# Patient Record
Sex: Male | Born: 1960
Health system: Southern US, Community
[De-identification: ages and names within clinical notes are randomized; demographics above are authoritative.]

## PROBLEM LIST (undated history)

## (undated) DIAGNOSIS — I472 Ventricular tachycardia: Secondary | ICD-10-CM

## (undated) DIAGNOSIS — I4729 Other ventricular tachycardia: Secondary | ICD-10-CM

## (undated) DIAGNOSIS — I10 Essential (primary) hypertension: Secondary | ICD-10-CM

## (undated) DIAGNOSIS — R001 Bradycardia, unspecified: Secondary | ICD-10-CM

## (undated) DIAGNOSIS — R55 Syncope and collapse: Secondary | ICD-10-CM

## (undated) DIAGNOSIS — E785 Hyperlipidemia, unspecified: Secondary | ICD-10-CM

## (undated) DIAGNOSIS — I2510 Atherosclerotic heart disease of native coronary artery without angina pectoris: Secondary | ICD-10-CM

## (undated) DIAGNOSIS — R634 Abnormal weight loss: Secondary | ICD-10-CM

## (undated) DIAGNOSIS — I471 Supraventricular tachycardia, unspecified: Secondary | ICD-10-CM

## (undated) DIAGNOSIS — Z9289 Personal history of other medical treatment: Secondary | ICD-10-CM

## (undated) HISTORY — PX: CARDIAC CATHETERIZATION: SHX172

## (undated) HISTORY — DX: Other ventricular tachycardia: I47.29

## (undated) HISTORY — DX: Bradycardia, unspecified: R00.1

## (undated) HISTORY — DX: Supraventricular tachycardia: I47.1

## (undated) HISTORY — DX: Supraventricular tachycardia, unspecified: I47.10

## (undated) HISTORY — DX: Syncope and collapse: R55

## (undated) HISTORY — DX: Ventricular tachycardia: I47.2

## (undated) HISTORY — DX: Abnormal weight loss: R63.4

## (undated) HISTORY — DX: Essential (primary) hypertension: I10

## (undated) HISTORY — DX: Hyperlipidemia, unspecified: E78.5

## (undated) HISTORY — DX: Personal history of other medical treatment: Z92.89

---

## 2001-01-10 ENCOUNTER — Emergency Department (HOSPITAL_COMMUNITY): Admission: EM | Admit: 2001-01-10 | Discharge: 2001-01-10 | Payer: Self-pay | Admitting: Emergency Medicine

## 2001-01-10 ENCOUNTER — Encounter: Payer: Self-pay | Admitting: *Deleted

## 2009-09-12 ENCOUNTER — Emergency Department (HOSPITAL_COMMUNITY): Admission: EM | Admit: 2009-09-12 | Discharge: 2009-09-12 | Payer: Self-pay | Admitting: Emergency Medicine

## 2009-09-16 ENCOUNTER — Inpatient Hospital Stay (HOSPITAL_COMMUNITY): Admission: AD | Admit: 2009-09-16 | Discharge: 2009-09-21 | Payer: Self-pay | Admitting: Cardiovascular Disease

## 2009-09-20 ENCOUNTER — Encounter (INDEPENDENT_AMBULATORY_CARE_PROVIDER_SITE_OTHER): Payer: Self-pay | Admitting: Cardiovascular Disease

## 2011-03-15 LAB — BASIC METABOLIC PANEL
BUN: 15 mg/dL (ref 6–23)
Calcium: 8.5 mg/dL (ref 8.4–10.5)
Calcium: 8.5 mg/dL (ref 8.4–10.5)
Calcium: 8.7 mg/dL (ref 8.4–10.5)
Chloride: 103 mEq/L (ref 96–112)
Creatinine, Ser: 0.99 mg/dL (ref 0.4–1.5)
GFR calc Af Amer: >60 mL/min (ref >60)
GFR calc non Af Amer: >60 mL/min (ref >60)
GFR calc non Af Amer: >60 mL/min (ref >60)
GFR calc non Af Amer: >60 mL/min (ref >60)
GFR calc non Af Amer: >60 mL/min (ref >60)
Glucose, Bld: 106 mg/dL — ABNORMAL HIGH (ref 70–99)
Glucose, Bld: 109 mg/dL — ABNORMAL HIGH (ref 70–99)
Glucose, Bld: 111 mg/dL — ABNORMAL HIGH (ref 70–99)
Potassium: 3.5 mEq/L (ref 3.5–5.1)
Potassium: 3.9 mEq/L (ref 3.5–5.1)
Potassium: 4.2 mEq/L (ref 3.5–5.1)
Sodium: 136 mEq/L (ref 135–145)
Sodium: 137 mEq/L (ref 135–145)
Sodium: 137 mEq/L (ref 135–145)
Sodium: 139 mEq/L (ref 135–145)

## 2011-03-15 LAB — COMPREHENSIVE METABOLIC PANEL
ALT: 21 U/L (ref 0–53)
ALT: 21 U/L (ref 0–53)
AST: 25 U/L (ref 0–37)
Albumin: 3.8 g/dL (ref 3.5–5.2)
Albumin: 4.1 g/dL (ref 3.5–5.2)
Alkaline Phosphatase: 64 U/L (ref 39–117)
CO2: 25 mEq/L (ref 19–32)
Calcium: 8.9 mg/dL (ref 8.4–10.5)
Chloride: 105 mEq/L (ref 96–112)
Creatinine, Ser: 1 mg/dL (ref 0.4–1.5)
GFR calc Af Amer: >60 mL/min (ref >60)
GFR calc Af Amer: >60 mL/min (ref >60)
GFR calc non Af Amer: >60 mL/min (ref >60)
Potassium: 5.4 mEq/L — ABNORMAL HIGH (ref 3.5–5.1)
Sodium: 138 mEq/L (ref 135–145)
Sodium: 139 mEq/L (ref 135–145)
Total Protein: 7.2 g/dL (ref 6.0–8.3)

## 2011-03-15 LAB — CBC
HCT: 36.2 % — ABNORMAL LOW (ref 39.0–52.0)
HCT: 37.8 % — ABNORMAL LOW (ref 39.0–52.0)
HCT: 40.5 % (ref 39.0–52.0)
HCT: 40.9 % (ref 39.0–52.0)
Hemoglobin: 13 g/dL (ref 13.0–17.0)
Hemoglobin: 13.7 g/dL (ref 13.0–17.0)
Hemoglobin: 14 g/dL (ref 13.0–17.0)
MCHC: 34.2 g/dL (ref 30.0–36.0)
MCHC: 34.3 g/dL (ref 30.0–36.0)
MCHC: 34.3 g/dL (ref 30.0–36.0)
MCV: 92.5 fL (ref 78.0–100.0)
MCV: 93.3 fL (ref 78.0–100.0)
Platelets: 121 10*3/uL — ABNORMAL LOW (ref 150–400)
Platelets: 152 10*3/uL (ref 150–400)
Platelets: 162 10*3/uL (ref 150–400)
Platelets: 178 10*3/uL (ref 150–400)
RBC: 4.34 MIL/uL (ref 4.22–5.81)
RBC: 4.38 MIL/uL (ref 4.22–5.81)
RDW: 12.5 % (ref 11.5–15.5)
RDW: 12.5 % (ref 11.5–15.5)
RDW: 12.8 % (ref 11.5–15.5)
RDW: 12.9 % (ref 11.5–15.5)
WBC: 5.4 10*3/uL (ref 4.0–10.5)
WBC: 5.9 10*3/uL (ref 4.0–10.5)
WBC: 7.6 10*3/uL (ref 4.0–10.5)

## 2011-03-15 LAB — HEMOGLOBIN A1C
Hgb A1c MFr Bld: 5.6 % (ref 4.6–6.1)
Mean Plasma Glucose: 114 mg/dL

## 2011-03-15 LAB — MAGNESIUM: Magnesium: 2.3 mg/dL (ref 1.5–2.5)

## 2011-03-15 LAB — PROTIME-INR: INR: 1.02 (ref 0.00–1.49)

## 2011-03-15 LAB — DIFFERENTIAL
Basophils Relative: 0 % (ref 0–1)
Eosinophils Absolute: 0 10*3/uL (ref 0.0–0.7)
Monocytes Absolute: 0.5 10*3/uL (ref 0.1–1.0)
Monocytes Relative: 4 % (ref 3–12)

## 2011-03-15 LAB — CARDIAC PANEL(CRET KIN+CKTOT+MB+TROPI)
CK, MB: 3.9 ng/mL (ref 0.3–4.0)
CK, MB: 43.5 ng/mL — ABNORMAL HIGH (ref 0.3–4.0)
CK, MB: 8.2 ng/mL — ABNORMAL HIGH (ref 0.3–4.0)
CK, MB: 86.3 ng/mL — ABNORMAL HIGH (ref 0.3–4.0)
Relative Index: 10.8 — ABNORMAL HIGH (ref 0.0–2.5)
Relative Index: 3.1 — ABNORMAL HIGH (ref 0.0–2.5)
Relative Index: 4.5 — ABNORMAL HIGH (ref 0.0–2.5)
Troponin I: 2.45 ng/mL (ref 0.00–0.06)
Troponin I: 3.15 ng/mL (ref 0.00–0.06)

## 2011-03-15 LAB — LIPID PANEL
LDL Cholesterol: 163 mg/dL — ABNORMAL HIGH (ref 0–99)
Total CHOL/HDL Ratio: 5.7 RATIO
VLDL: 21 mg/dL (ref 0–40)

## 2011-03-15 LAB — BRAIN NATRIURETIC PEPTIDE: Pro B Natriuretic peptide (BNP): 161 pg/mL — ABNORMAL HIGH (ref 0.0–100.0)

## 2011-03-15 LAB — POCT CARDIAC MARKERS
Troponin i, poc: <0.05 ng/mL (ref 0.00–0.09)
Troponin i, poc: <0.05 ng/mL (ref 0.00–0.09)

## 2011-03-15 LAB — GLUCOSE, CAPILLARY: Glucose-Capillary: 101 mg/dL — ABNORMAL HIGH (ref 70–99)

## 2011-06-26 ENCOUNTER — Emergency Department (HOSPITAL_BASED_OUTPATIENT_CLINIC_OR_DEPARTMENT_OTHER)
Admission: EM | Admit: 2011-06-26 | Discharge: 2011-06-26 | Disposition: A | Discharge status: Home / Self Care | Payer: Federal, State, Local not specified - PPO | Attending: Emergency Medicine | Admitting: Emergency Medicine

## 2011-06-26 ENCOUNTER — Encounter: Payer: Self-pay | Admitting: Student

## 2011-06-26 ENCOUNTER — Emergency Department (INDEPENDENT_AMBULATORY_CARE_PROVIDER_SITE_OTHER): Payer: Federal, State, Local not specified - PPO

## 2011-06-26 ENCOUNTER — Emergency Department (HOSPITAL_BASED_OUTPATIENT_CLINIC_OR_DEPARTMENT_OTHER): Payer: Federal, State, Local not specified - PPO

## 2011-06-26 DIAGNOSIS — A779 Spotted fever, unspecified: Secondary | ICD-10-CM | POA: Insufficient documentation

## 2011-06-26 DIAGNOSIS — I252 Old myocardial infarction: Secondary | ICD-10-CM | POA: Insufficient documentation

## 2011-06-26 DIAGNOSIS — A770 Spotted fever due to Rickettsia rickettsii: Secondary | ICD-10-CM | POA: Diagnosis present

## 2011-06-26 DIAGNOSIS — I2510 Atherosclerotic heart disease of native coronary artery without angina pectoris: Secondary | ICD-10-CM | POA: Insufficient documentation

## 2011-06-26 DIAGNOSIS — R509 Fever, unspecified: Secondary | ICD-10-CM

## 2011-06-26 DIAGNOSIS — D696 Thrombocytopenia, unspecified: Secondary | ICD-10-CM | POA: Insufficient documentation

## 2011-06-26 DIAGNOSIS — R51 Headache: Secondary | ICD-10-CM | POA: Insufficient documentation

## 2011-06-26 HISTORY — DX: Atherosclerotic heart disease of native coronary artery without angina pectoris: I25.10

## 2011-06-26 LAB — COMPREHENSIVE METABOLIC PANEL
ALT: 63 U/L — ABNORMAL HIGH (ref 0–53)
AST: 54 U/L — ABNORMAL HIGH (ref 0–37)
Alkaline Phosphatase: 60 U/L (ref 39–117)
CO2: 28 mEq/L (ref 19–32)
Calcium: 9.1 mg/dL (ref 8.4–10.5)
Chloride: 97 mEq/L (ref 96–112)
GFR calc Af Amer: >60 mL/min (ref >60)
GFR calc non Af Amer: >60 mL/min (ref >60)
Glucose, Bld: 120 mg/dL — ABNORMAL HIGH (ref 70–99)
Potassium: 4.3 mEq/L (ref 3.5–5.1)
Sodium: 138 mEq/L (ref 135–145)
Total Bilirubin: 0.5 mg/dL (ref 0.3–1.2)

## 2011-06-26 LAB — CBC
Hemoglobin: 13.6 g/dL (ref 13.0–17.0)
Platelets: 64 10*3/uL — ABNORMAL LOW (ref 150–400)
RBC: 4.38 MIL/uL (ref 4.22–5.81)
WBC: 4.2 10*3/uL (ref 4.0–10.5)

## 2011-06-26 LAB — URINALYSIS, ROUTINE W REFLEX MICROSCOPIC
Bilirubin Urine: NEGATIVE
Hgb urine dipstick: NEGATIVE
Ketones, ur: 15 mg/dL — AB
Specific Gravity, Urine: 1.026 (ref 1.005–1.030)
Urobilinogen, UA: 0.2 mg/dL (ref 0.0–1.0)

## 2011-06-26 LAB — URINE MICROSCOPIC-ADD ON

## 2011-06-26 MED ORDER — DOXYCYCLINE HYCLATE 100 MG PO CAPS: 100.0000 mg | ORAL_CAPSULE | Freq: Two times a day (BID) | ORAL | Status: AC

## 2011-06-26 MED ORDER — DOXYCYCLINE HYCLATE 100 MG PO CAPS
100.0000 mg | ORAL_CAPSULE | Freq: Two times a day (BID) | ORAL | Status: DC
Start: 2011-06-26 — End: 2011-06-26

## 2011-06-26 MED ORDER — SODIUM CHLORIDE 0.9 % IV BOLUS (SEPSIS)
1000.0000 mL | Freq: Once | INTRAVENOUS | Status: AC
  Administered 2011-06-26: 1000 mL via INTRAVENOUS

## 2011-06-26 NOTE — ED Notes (Signed)
Patient denies pain and is resting comfortably.  

## 2011-06-26 NOTE — ED Notes (Signed)
MD at bedside. 

## 2011-06-26 NOTE — ED Provider Notes (Signed)
History     Chief Complaint  Patient presents with  . Fever    fever since wednesday with high of 102   Patient is a 50 y.o. male presenting with fever. The history is provided by the patient.  Fever Primary symptoms of the febrile illness include fever, fatigue, myalgias and arthralgias. The current episode started more than 1 week ago. This is a new problem. The problem has been gradually worsening.  The fever began more than 1 week ago. The maximum temperature recorded prior to his arrival was 102 to 102.9 F. The temperature was taken by an oral thermometer.  The onset of the illness is associated with animal contact.   Pt removed a tick 2 weeks ago.  Pt has been sick for the past week.  Pt was seen at [facility] and was told platelets were low.   Past Medical History  Diagnosis Date  . Myocardial infarction   . Coronary artery disease     Past Surgical History  Procedure Date  . Cardiac catheterization     stents placed 2010    History reviewed. No pertinent family history.  History  Substance Use Topics  . Smoking status: Never Smoker   . Smokeless tobacco: Not on file  . Alcohol Use: Yes      Review of Systems  Constitutional: Positive for fever and fatigue.  Musculoskeletal: Positive for myalgias and arthralgias.  All other systems reviewed and are negative.    Physical Exam  BP 97/62  Pulse 57  Temp(Src) 99.1 F (37.3 C) (Oral)  Resp 20  Wt 182 lb (82.555 kg)  SpO2 100%  Physical Exam  Constitutional: He is oriented to person, place, and time. He appears well-developed and well-nourished. No distress.  HENT:  Head: Normocephalic and atraumatic.  Mouth/Throat: No oropharyngeal exudate.  Eyes: Conjunctivae are normal. Pupils are equal, round, and reactive to light.  Neck: Normal range of motion. Neck supple. No thyromegaly present.  Cardiovascular: Normal rate and regular rhythm.   Pulmonary/Chest: Effort normal and breath sounds normal.    Abdominal: Soft.  Musculoskeletal: Normal range of motion.  Lymphadenopathy:    He has no cervical adenopathy.  Neurological: He is alert and oriented to person, place, and time.  Skin: Skin is warm and dry.  Psychiatric: He has a normal mood and affect.    ED Course  Procedures  MDM Pt has low platelets,  Slight increase in LFTs.  These lab abnormalities are seen with RMSF.  I have a RMSF titer pending.  Dr. Preston Fleeting in to see and examine.  Pt given IV fluids.  I will treat with doxy x 14 days.  Pt advised to have repeat platelets and lft's in 1 week as well as followup for possible tick illness.  Pt to go to [facility] ED if symptoms worsen or change.      Langston Masker, [place] 06/26/11 [phone]

## 2011-06-26 NOTE — ED Notes (Signed)
Pt in with c/o fever since Wednesday, reports a near syncopal episode on Sunday while in shower. Denies syncope but reports getting very dizziness and lightheaded. Denies N V D CP LOC SOB. Reports Fever aches chills and generalized fatigue and weakness.

## 2011-06-26 NOTE — ED Provider Notes (Addendum)
History     Chief Complaint  Patient presents with  . Fever    fever since wednesday with high of 102  History is per midlevel H&P HPI Per midlevel note Past Medical History  Diagnosis Date  . Myocardial infarction   . Coronary artery disease     Past Surgical History  Procedure Date  . Cardiac catheterization     stents placed 2010    History reviewed. No pertinent family history.  History  Substance Use Topics  . Smoking status: Never Smoker   . Smokeless tobacco: Not on file  . Alcohol Use: Yes      Review of Systems Per midlevel note Physical Exam  BP 95/54  Pulse 50  Temp(Src) 99.1 F (37.3 C) (Oral)  Resp 20  Wt 182 lb (82.555 kg)  SpO2 100%  Physical Exam Physical exam is per midlevel H&P  ED Course  Procedures  MDM 50 year old male with one week of fever, mild headache. Noted to have thrombocytopenia at an [facility]. He does not appear toxic, liver is not enlarged, no rash. Mild elevation of LFT's. History of mild thrombocytopenia. Will treat as possible RMSF, patient advised of need for follow-up lab work to make sure LFT's and platelet count returns to normal.      Dione Booze, MD 06/28/11 1502  Dione Booze, MD 06/30/11 1556  Dione Booze, MD 06/30/11 1557  Dione Booze, MD 06/30/11 1558

## 2011-06-26 NOTE — ED Notes (Signed)
Pt sts he started feeling "weak last Wednesday evening after work". Pt denies pain, shob, n/v/d.

## 2011-06-28 DIAGNOSIS — A770 Spotted fever due to Rickettsia rickettsii: Secondary | ICD-10-CM | POA: Diagnosis present

## 2011-07-01 NOTE — ED Provider Notes (Signed)
I have personally performed and participated in all the services and procedures documented herein. I have reviewed the findings with the patient. Please see my other note for details of my face-to-face encounter.  Dione Booze, MD 07/01/11 [phone]

## 2013-05-24 ENCOUNTER — Encounter (HOSPITAL_COMMUNITY): Payer: Self-pay | Admitting: *Deleted

## 2013-05-24 ENCOUNTER — Emergency Department (HOSPITAL_COMMUNITY): Payer: Federal, State, Local not specified - PPO

## 2013-05-24 ENCOUNTER — Emergency Department (HOSPITAL_COMMUNITY)
Admission: EM | Admit: 2013-05-24 | Discharge: 2013-05-24 | Disposition: A | Discharge status: Home / Self Care | Payer: Federal, State, Local not specified - PPO | Attending: Emergency Medicine | Admitting: Emergency Medicine

## 2013-05-24 DIAGNOSIS — Z791 Long term (current) use of non-steroidal anti-inflammatories (NSAID): Secondary | ICD-10-CM | POA: Insufficient documentation

## 2013-05-24 DIAGNOSIS — F1010 Alcohol abuse, uncomplicated: Secondary | ICD-10-CM | POA: Insufficient documentation

## 2013-05-24 DIAGNOSIS — Z79899 Other long term (current) drug therapy: Secondary | ICD-10-CM | POA: Insufficient documentation

## 2013-05-24 DIAGNOSIS — I2510 Atherosclerotic heart disease of native coronary artery without angina pectoris: Secondary | ICD-10-CM | POA: Insufficient documentation

## 2013-05-24 DIAGNOSIS — R55 Syncope and collapse: Secondary | ICD-10-CM | POA: Insufficient documentation

## 2013-05-24 DIAGNOSIS — J9819 Other pulmonary collapse: Secondary | ICD-10-CM | POA: Insufficient documentation

## 2013-05-24 DIAGNOSIS — Z9861 Coronary angioplasty status: Secondary | ICD-10-CM | POA: Insufficient documentation

## 2013-05-24 DIAGNOSIS — I252 Old myocardial infarction: Secondary | ICD-10-CM | POA: Insufficient documentation

## 2013-05-24 LAB — CBC WITH DIFFERENTIAL/PLATELET
Eosinophils Relative: 1 % (ref 0–5)
Hemoglobin: 14.8 g/dL (ref 13.0–17.0)
Lymphocytes Relative: 14 % (ref 12–46)
Lymphs Abs: 0.8 10*3/uL (ref 0.7–4.0)
MCV: 94.3 fL (ref 78.0–100.0)
Monocytes Relative: 6 % (ref 3–12)
Neutrophils Relative %: 79 % — ABNORMAL HIGH (ref 43–77)
Platelets: 144 10*3/uL — ABNORMAL LOW (ref 150–400)
RBC: 4.57 MIL/uL (ref 4.22–5.81)
WBC: 5.9 10*3/uL (ref 4.0–10.5)

## 2013-05-24 LAB — BASIC METABOLIC PANEL
CO2: 27 mEq/L (ref 19–32)
Glucose, Bld: 156 mg/dL — ABNORMAL HIGH (ref 70–99)
Potassium: 4 mEq/L (ref 3.5–5.1)
Sodium: 137 mEq/L (ref 135–145)

## 2013-05-24 LAB — POCT I-STAT TROPONIN I

## 2013-05-24 LAB — URINALYSIS, ROUTINE W REFLEX MICROSCOPIC
Leukocytes, UA: NEGATIVE
Nitrite: NEGATIVE
Specific Gravity, Urine: 1.011 (ref 1.005–1.030)
pH: 7 (ref 5.0–8.0)

## 2013-05-24 NOTE — ED Notes (Signed)
Patient presents to ed c/o sycopal episode  This am. States he was feeling hot and was going to the bathroom and fainted. Denies chest pain at present. However family states he was in the ed for similar complains before with all negative workup went to the cath lab and had stent place.

## 2013-05-24 NOTE — ED Notes (Signed)
Patient resting on stretcher states he is feeling better

## 2013-05-24 NOTE — ED Notes (Signed)
Pt refusing for admission and wanting to go home and signed AMA.

## 2013-05-24 NOTE — Progress Notes (Signed)
I was notified by the ER about Mr. Serviss presentation. He had some prodrome with cold/clammy sensation and dizziness before passing out in the bathroom (he made it there) at [employer] and hitting his head. His CT head was unrevealing, initial troponins and ECG unrevealing. However, given his 10/10 STEMI of LAD with stent and presence CAD and fall/trauma, it seems prudent to observe him overnight on telemetry in setting of syncope, update Echocardiogram and likely event monitor at home if unrevealing studies. However, before I could go to the ER to visit with Mr. Marney, he decided to leave (after the ER team discussed the recommendation). I will update Dr. Allyson Sabal to help arrange prompt follow-up.   Leeann Must, MD

## 2013-05-24 NOTE — ED Provider Notes (Signed)
History     CSN: [id number]  Arrival date & time 05/24/13  1159   First MD Initiated Contact with Patient 05/24/13 1218      Chief Complaint  Patient presents with  . Loss of Consciousness    (Consider location/radiation/quality/duration/timing/severity/associated sxs/prior treatment) HPI Comments: Pt presents to the ED for syncopal episode.  States he was with friends about to eat breakfast at [employer] when he began to feel cold sweats, nausea, and lightheaded.  He got the sensation that he was about to pass out so he went to the bathroom to splash some cold water on his face when he suddenly got very dizzy and passed out.  Several witnesses state he hit his head on the floor but was only unconscious for a few seconds.  When he awoke, he was oriented but felt somewhat weak which continues in the ED.  Pt has cardiac hx, prior MI with cardiac stenting- currently on ASA and plavix.  No hx of seizure d/o.  No current chest pain, SOB, palpitations, dizziness, headache, confusion, or other bodily pain.  No prior syncopal episodes like this.  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  Has been eating and drinking normally.  The history is provided by the patient.    Past Medical History  Diagnosis Date  . Myocardial infarction   . Coronary artery disease     Past Surgical History  Procedure Laterality Date  . Cardiac catheterization      stents placed 2010    History reviewed. No pertinent family history.  History  Substance Use Topics  . Smoking status: Never Smoker   . Smokeless tobacco: Not on file  . Alcohol Use: Yes      Review of Systems  Neurological: Positive for syncope.  All other systems reviewed and are negative.    Allergies  Review of patient's allergies indicates no known allergies.  Home Medications   Current Outpatient Rx  Name  Route  Sig  Dispense  Refill  . acetaminophen (TYLENOL) 500 MG tablet   Oral   Take 1,000  mg by mouth as needed. For fever and pain          . aspirin 81 MG EC tablet   Oral   Take 162 mg by mouth daily.          . metoprolol tartrate (LOPRESSOR) 25 MG tablet   Oral   Take 25 mg by mouth 2 (two) times daily.           . prasugrel (EFFIENT) 10 MG TABS   Oral   Take 10 mg by mouth daily.          . ramipril (ALTACE) 2.5 MG capsule   Oral   Take 2.5 mg by mouth daily.          . rosuvastatin (CRESTOR) 40 MG tablet   Oral   Take 40 mg by mouth daily.             BP 141/68  Pulse 59  Resp 18  SpO2 100%  Physical Exam  Nursing note and vitals reviewed. Constitutional: He is oriented to person, place, and time. He appears well-developed and well-nourished.  HENT:  Head: Normocephalic and atraumatic. Head is without raccoon's eyes, without Battle's sign, without abrasion, without contusion and without laceration.  Right Ear: Tympanic membrane and ear canal normal.  Left Ear: Tympanic membrane and ear canal normal.  Mouth/Throat: Uvula is midline,  oropharynx is clear and moist and mucous membranes are normal. No posterior oropharyngeal edema or posterior oropharyngeal erythema.  No signs of head trauma  Eyes: Conjunctivae, EOM and lids are normal. Pupils are equal, round, and reactive to light. Right conjunctiva has no hemorrhage. Left conjunctiva has no hemorrhage. Right eye exhibits no nystagmus. Left eye exhibits no nystagmus.  Neck: Normal range of motion. Neck supple.  Cardiovascular: Normal rate, regular rhythm and normal heart sounds.   Pulmonary/Chest: Effort normal and breath sounds normal.  Musculoskeletal: Normal range of motion.  Neurological: He is alert and oriented to person, place, and time. He has normal strength. He displays no tremor. No cranial nerve deficit or sensory deficit. He displays no seizure activity. Gait normal.  CN grossly intact, moves all extremities appropriately, no acute neuro deficits or facial droop appreciated, normal  gait  Skin: Skin is warm and dry.  Psychiatric: He has a normal mood and affect.    ED Course  Procedures (including critical care time)   Date: 05/24/2013  Rate: 57  Rhythm: normal sinus rhythm  QRS Axis: normal  Intervals: normal  ST/T Wave abnormalities: normal  Conduction Disutrbances:none  Narrative Interpretation: NSR, no STEMI  Old EKG Reviewed: none available   Labs Reviewed  CBC WITH DIFFERENTIAL - Abnormal; Notable for the following:    Platelets 144 (*)    Neutrophils Relative % 79 (*)    All other components within normal limits  BASIC METABOLIC PANEL - Abnormal; Notable for the following:    Glucose, Bld 156 (*)    All other components within normal limits  URINALYSIS, ROUTINE W REFLEX MICROSCOPIC  POCT I-STAT TROPONIN I  POCT I-STAT TROPONIN I   Dg Chest 2 View  05/24/2013   *RADIOLOGY REPORT*  Clinical Data: Loss of consciousness  CHEST - 2 VIEW  Comparison: 06/26/2011  Findings: Cardiomediastinal silhouette is stable.  No acute infiltrate or pleural effusion.  No pulmonary edema.  Bony thorax is stable.  IMPRESSION: No active disease.  No significant change.   Original Report Authenticated By: Natasha Mead, M.D.   Ct Head Wo Contrast  05/24/2013   *RADIOLOGY REPORT*  Clinical Data: Loss of consciousness.  CT HEAD WITHOUT CONTRAST  Technique:  Contiguous axial images were obtained from the base of the skull through the vertex without contrast.  Comparison: None.  Findings: No evidence for acute hemorrhage, mass lesion, midline shift, hydrocephalus or large infarct.  No acute bony abnormality.  IMPRESSION: Negative head CT.   Original Report Authenticated By: Richarda Overlie, M.D.     1. Syncope and collapse       MDM   CT head negative.  EKG NSR, no acute ischemic changes.  Trop negative x2.  CXR clear.  Labs largely WNL as above. U/a without signs of infection.  Consulted [facility] cardiology, Dr. Tresa Endo, advised to observe pt on telemetry overnight.  Discussed this  with pt, he does not feel this is necessary and he does not want to stay.  Discussed that there are possible risks/complications associated with this.  He acknowledged understanding and still wishes to leave- signed out AMA.  Copies of labs and CT scan given.  Return precautions advised.       Garlon Hatchet, PA-C 05/24/13 1918

## 2013-05-25 ENCOUNTER — Ambulatory Visit (INDEPENDENT_AMBULATORY_CARE_PROVIDER_SITE_OTHER): Payer: Federal, State, Local not specified - PPO | Admitting: Physician Assistant

## 2013-05-25 ENCOUNTER — Encounter: Payer: Self-pay | Admitting: Physician Assistant

## 2013-05-25 VITALS — BP 120/80 | HR 70 | Ht 72.0 in | Wt 169.4 lb

## 2013-05-25 DIAGNOSIS — I2510 Atherosclerotic heart disease of native coronary artery without angina pectoris: Secondary | ICD-10-CM

## 2013-05-25 DIAGNOSIS — R55 Syncope and collapse: Secondary | ICD-10-CM

## 2013-05-25 DIAGNOSIS — I10 Essential (primary) hypertension: Secondary | ICD-10-CM | POA: Insufficient documentation

## 2013-05-25 NOTE — Patient Instructions (Addendum)
He was scheduled for 30 day followup with Dr. Allyson Sabal.  His coughing he be seen any sooner. We'll also schedule you for 2-D echocardiogram.  Your physician has requested that you have an echocardiogram. Echocardiography is a painless test that uses sound waves to create images of your heart. It provides your doctor with information about the size and shape of your heart and how well your heart's chambers and valves are working. This procedure takes approximately one hour. There are no restrictions for this procedure.  Your physician has recommended that you wear an event monitor. Event monitors are medical devices that record the heart's electrical activity. Doctors most often [place] these monitors to diagnose arrhythmias. Arrhythmias are problems with the speed or rhythm of the heartbeat. The monitor is a small, portable device. You can wear one while you do your normal daily activities. This is usually used to diagnose what is causing palpitations/syncope (passing out).

## 2013-05-25 NOTE — ED Provider Notes (Signed)
Medical screening examination/treatment/procedure(s) were conducted as a shared visit with non-physician practitioner(s) and myself.  I personally evaluated the patient during the encounter   Loren Racer, MD 05/25/13 [phone]

## 2013-05-25 NOTE — Assessment & Plan Note (Signed)
Most recent nuclear stress test as Eric Ramos 2011 post-rest ejection fraction was 51% and was negative for ischemia. Was a mild scar in the LAD distribution.

## 2013-05-25 NOTE — Assessment & Plan Note (Signed)
Well-controlled  at this time 

## 2013-05-25 NOTE — Progress Notes (Signed)
Date:  05/25/2013   ID:  Eric Ramos, DOB 1961-01-29, MRN [id number]  PCP:  Provider Not In System  Primary Cardiologist:  Allyson Sabal     History of Present Illness: Eric Ramos is a 52 y.o. very fit appearing male with a history coronary disease, hypertension, hyperlipidemia. 09/16/2009 patient suffered an anterior wall MI received a drug-eluting stent to the proximal LAD. He had moderate disease in the right coronary artery with anterior apical hypokinesia. His last Myoview stress test was 12/13/2009 showed small anterior apical scar with no ischemia. 2-D echocardiogram was October 2010 showed ejection fraction of 50-55%  Patient was seen yesterday at [facility] emergency room after having a syncopal episode while waiting for a table at [employer]. Patient states he started feeling warm and seeing spots and was on his way to the bathroom when he had a syncopal event. He also noted some chills the night before.  He reports some nausea and dizziness prior to the event. He also reports a similar episode approximately 2 years ago after having a fever. 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 Also reports a decrease in weight of approximately 10-13 pounds in the last 2 months. Also reports more activity and lifting weights more frequently he also typically plays full court basketball on Thursday evenings. His father had liver cancer.  Head [facility] at [facility] was negative for acute abnormality.  Troponin was negative.  The patient currently denies nausea, vomiting, fever, chest pain, shortness of breath, orthopnea, dizziness, PND, cough, congestion, abdominal pain, hematochezia, melena, lower extremity edema, claudication.   Wt Readings from Last 3 Encounters:  05/25/13 169 lb 6.4 oz (76.839 kg)  06/26/11 182 lb (82.555 kg)     Past Medical History  Diagnosis Date  . Myocardial infarction   . Coronary artery disease     Current Outpatient  Prescriptions  Medication Sig Dispense Refill  . aspirin 81 MG EC tablet Take 162 mg by mouth daily.       . clopidogrel (PLAVIX) 75 MG tablet Take 75 mg by mouth daily.      . metoprolol tartrate (LOPRESSOR) 25 MG tablet Take 25 mg by mouth daily.       . ramipril (ALTACE) 2.5 MG capsule Take 2.5 mg by mouth daily.       . rosuvastatin (CRESTOR) 40 MG tablet Take 40 mg by mouth daily.         No current facility-administered medications for this visit.    Allergies:   No Known Allergies  Social History:  The patient  reports that he has never smoked. He has never used smokeless tobacco. He reports that  drinks alcohol. He reports that he does not use illicit drugs.   History reviewed. No pertinent family history.   Patient's father had liver cancer.  ROS:  Please see the history of present illness.  All other systems reviewed and negative.   PHYSICAL EXAM: VS:  BP 120/80  Pulse 70  Ht 6' (1.829 m)  Wt 169 lb 6.4 oz (76.839 kg)  BMI 22.97 kg/m2 Well nourished, well developed, in no acute distress HEENT: Pupils are equal round react to light accommodation extraocular movements are intact.  Neck: no JVDNo cervical lymphadenopathy. Cardiac: Regular rate and rhythm without murmurs rubs or gallops. Lungs:  clear to auscultation bilaterally, no wheezing, rhonchi or rales Abd: soft, nontender, positive bowel sounds all quadrants, no hepatosplenomegaly Ext: no  lower extremity edema.  2+ radial and dorsalis pedis pulses. Skin: warm and dry Neuro:  Grossly normal  EKG:  Sinus bradycardia 59 bpm.  Diffuse nonspecific ST changes likely from repolarization essentially unchanged from prior  ASSESSMENT AND PLAN:  Problem List Items Addressed This Visit   Coronary artery disease     Most recent nuclear stress test as Asher Muir 2011 post-rest ejection fraction was 51% and was negative for ischemia. Was a mild scar in the LAD distribution.    Essential hypertension     Well-controlled at this  time.    Syncope     Patient had a negative head CT on May 24, 2013. We'll evaluate the patient for cardiac source of syncope with a 2-D echocardiogram and 30 day cardiac monitor.  I recommend patient not play a full court basketball until at least after a 2-D echocardiogram.    Relevant Orders      2D Echocardiogram without contrast      Cardiac event monitor    Other Visit Diagnoses   CAD (coronary artery disease)    -  Primary    Relevant Orders       EKG 12-Lead

## 2013-05-25 NOTE — Assessment & Plan Note (Signed)
Patient had a negative head CT on May 24, 2013. We'll evaluate the patient for cardiac source of syncope with a 2-D echocardiogram and 30 day cardiac monitor.  I recommend patient not play a full court basketball until at least after a 2-D echocardiogram.

## 2013-05-27 ENCOUNTER — Ambulatory Visit (HOSPITAL_COMMUNITY)
Admission: RE | Admit: 2013-05-27 | Discharge: 2013-05-27 | Disposition: A | Discharge status: Home / Self Care | Payer: Federal, State, Local not specified - PPO | Source: Ambulatory Visit | Attending: Cardiovascular Disease | Admitting: Cardiovascular Disease

## 2013-05-27 DIAGNOSIS — R55 Syncope and collapse: Secondary | ICD-10-CM | POA: Insufficient documentation

## 2013-05-27 DIAGNOSIS — I252 Old myocardial infarction: Secondary | ICD-10-CM | POA: Insufficient documentation

## 2013-05-27 DIAGNOSIS — I10 Essential (primary) hypertension: Secondary | ICD-10-CM | POA: Insufficient documentation

## 2013-05-27 DIAGNOSIS — I2510 Atherosclerotic heart disease of native coronary artery without angina pectoris: Secondary | ICD-10-CM | POA: Insufficient documentation

## 2013-05-27 NOTE — Progress Notes (Signed)
2D Echo Performed 05/27/2013    Zyere Jiminez, RCS  

## 2013-05-28 ENCOUNTER — Telehealth: Payer: Self-pay | Admitting: *Deleted

## 2013-05-28 NOTE — Telephone Encounter (Signed)
Give written work excuse letter to patient.

## 2013-05-29 ENCOUNTER — Telehealth: Payer: Self-pay | Admitting: *Deleted

## 2013-05-29 NOTE — Telephone Encounter (Signed)
error 

## 2013-06-03 ENCOUNTER — Telehealth: Payer: Self-pay | Admitting: *Deleted

## 2013-06-03 NOTE — Telephone Encounter (Signed)
Patient was given a second work letter first one was not accepted from his job, call patient and let him know his letter was availaible for pick up.

## 2013-06-21 ENCOUNTER — Encounter: Payer: Self-pay | Admitting: *Deleted

## 2013-06-23 ENCOUNTER — Encounter: Payer: Self-pay | Admitting: Cardiovascular Disease

## 2013-06-23 DIAGNOSIS — F411 Generalized anxiety disorder: Secondary | ICD-10-CM | POA: Insufficient documentation

## 2013-06-24 ENCOUNTER — Encounter: Payer: Self-pay | Admitting: Cardiovascular Disease

## 2013-06-24 ENCOUNTER — Ambulatory Visit (INDEPENDENT_AMBULATORY_CARE_PROVIDER_SITE_OTHER): Payer: Federal, State, Local not specified - PPO | Admitting: Cardiovascular Disease

## 2013-06-24 VITALS — BP 92/70 | HR 60 | Ht 72.75 in | Wt 168.9 lb

## 2013-06-24 DIAGNOSIS — Z79899 Other long term (current) drug therapy: Secondary | ICD-10-CM

## 2013-06-24 DIAGNOSIS — I2510 Atherosclerotic heart disease of native coronary artery without angina pectoris: Secondary | ICD-10-CM

## 2013-06-24 DIAGNOSIS — E785 Hyperlipidemia, unspecified: Secondary | ICD-10-CM

## 2013-06-24 DIAGNOSIS — R55 Syncope and collapse: Secondary | ICD-10-CM

## 2013-06-24 NOTE — Assessment & Plan Note (Signed)
Patient had a 2-D echo which was normal. He wore a 30 day event monitor during which time he had one presyncopal episode proportionally he was not wearing the monitor at the time. I did make him aware of the fact that he could not drive for 6 months after a true syncopal episode without a defined etiology.

## 2013-06-24 NOTE — Patient Instructions (Signed)
  Your physician wants you to follow-up with him in : 6 months                                            and with an extender in : 3 months                    You will receive a reminder letter in the mail one month in advance. If you don't receive a letter, please call our office to schedule the follow-up appointment.   Your physician recommends that you return for lab work in: 1-2 weeks, fasting  Your physician has recommended you make the following change in your medication: stop ramipril

## 2013-06-24 NOTE — Assessment & Plan Note (Signed)
Negative Myoview stress test 12/13/09. The patient denies chest pain or shortness of breath.

## 2013-06-24 NOTE — Assessment & Plan Note (Signed)
On statin therapy. We'll we'll recheck a fasting lipid profile

## 2013-06-24 NOTE — Progress Notes (Signed)
06/24/2013 [name]   Nov 09, 1961  [id number]  Primary Physician Provider Not In System Primary Cardiologist: Runell Gess MD Roseanne Reno   HPI:  Eric Ramos is a 52 y.o. very fit appearing male with a history coronary disease, hypertension, hyperlipidemia. 09/16/2009 patient suffered an anterior wall MI received a drug-eluting stent to the proximal LAD. He had moderate disease in the right coronary artery with anterior apical hypokinesia. His last Myoview stress test was 12/13/2009 showed small anterior apical scar with no ischemia. 2-D echocardiogram was October 2010 showed ejection fraction of 50-55%  Patient was seen yesterday at [facility] emergency room after having a syncopal episode while waiting for a table at [employer]. Patient states he started feeling warm and seeing spots and was on his way to the bathroom when he had a syncopal event. He also noted some chills the night before. He reports some nausea and dizziness prior to the event. He also reports a similar episode approximately 2 years ago after having a fever. 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 Also reports a decrease in weight of approximately 10-13 pounds in the last 2 months. Also reports more activity and lifting weights more frequently he also typically plays full court basketball on Thursday evenings. His father had liver cancer. Head CT at [facility] was negative for acute abnormality. Troponin was negative. Since he was seen by Huey Bienenstock PA-C the past one month ago he's had one syncopal episode while having a skin lesion removed by dermatologist. A 30 day event monitor was ordered but are partially he took off during the procedure. A 2-D echo was normal. I have counseled him about not driving for 6 months after a syncopal episode without etiology and he is aware of that. 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 His blood pressure is low today and therefore I am going  to discontinue his ramipril.    Current Outpatient Prescriptions  Medication Sig Dispense Refill  . aspirin 81 MG EC tablet Take 162 mg by mouth daily.       . clopidogrel (PLAVIX) 75 MG tablet Take 75 mg by mouth daily.      . metoprolol tartrate (LOPRESSOR) 25 MG tablet Take 25 mg by mouth 2 (two) times daily.       . ramipril (ALTACE) 2.5 MG capsule Take 2.5 mg by mouth daily.       . rosuvastatin (CRESTOR) 40 MG tablet Take 40 mg by mouth daily.         No current facility-administered medications for this visit.    No Known Allergies  History   Social History  . Marital Status: Divorced    Spouse Name: N/A    Number of Children: N/A  . Years of Education: N/A   Occupational History  . Not on file.   Social History Main Topics  . Smoking status: Never Smoker   . Smokeless tobacco: Never Used  . Alcohol Use: Yes     Comment: some alcohol  . Drug Use: No  . Sexually Active: Not on file   Other Topics Concern  . Not on file   Social History Narrative  . No narrative on file     Review of Systems: General: negative for chills, fever, night sweats or weight changes.  Cardiovascular: negative for chest pain, dyspnea on exertion, edema, orthopnea, palpitations, paroxysmal nocturnal dyspnea or shortness of breath Dermatological: negative for rash Respiratory:  negative for cough or wheezing Urologic: negative for hematuria Abdominal: negative for nausea, vomiting, diarrhea, bright red blood per rectum, melena, or hematemesis Neurologic: negative for visual changes, syncope, or dizziness All other systems reviewed and are otherwise negative except as noted above.    Blood pressure 92/70, pulse 60, height 6' 0.75" (1.848 m), weight 168 lb 14.4 oz (76.613 kg).  General appearance: alert and no distress Neck: no adenopathy, no carotid bruit, no JVD, supple, symmetrical, trachea midline and thyroid not enlarged, symmetric, no tenderness/mass/nodules Lungs: clear to  auscultation bilaterally Heart: regular rate and rhythm, S1, S2 normal, no murmur, click, rub or gallop Extremities: extremities normal, atraumatic, no cyanosis or edema  EKG not performed today  ASSESSMENT AND PLAN:   Coronary artery disease Negative Myoview stress test 12/13/09. The patient denies chest pain or shortness of breath.  Syncope Patient had a 2-D echo which was normal. He wore a 30 day event monitor during which time he had one presyncopal episode proportionally he was not wearing the monitor at the time. I did make him aware of the fact that he could not drive for 6 months after a true syncopal episode without a defined etiology.  Hyperlipidemia On statin therapy. We'll we'll recheck a fasting lipid profile      Runell Gess MD [facility], [facility] 06/24/2013 10:36 AM

## 2013-06-26 ENCOUNTER — Telehealth: Payer: Self-pay | Admitting: *Deleted

## 2013-06-26 ENCOUNTER — Encounter: Payer: Self-pay | Admitting: *Deleted

## 2013-06-26 NOTE — Telephone Encounter (Signed)
Patient came by to pick up letter.

## 2013-06-26 NOTE — Telephone Encounter (Signed)
Lm with patient asking him how does he want to get the letter?  Fax? Pick up?

## 2013-06-26 NOTE — Telephone Encounter (Signed)
**  Walk-In**  "Management says note must say unable to perform duties or say 'incapacitated' to be acceptable documentation.  Need ASAP."  Call to pt.  Stated he needs the note to state he was unable to work on 7.16.14 for the whole day.  Stated Dr. Allyson Sabal told him he didn't want him to go back to work that day.  Pt informed Samara Deist, Dr. Hazle Coca nurse will be notified.  Pt verbalized understanding and agreed w/ plan.  Message forwarded to K. Petra Kuba, Charity fundraiser.

## 2013-07-17 ENCOUNTER — Other Ambulatory Visit: Payer: Self-pay | Admitting: *Deleted

## 2013-07-17 DIAGNOSIS — R55 Syncope and collapse: Secondary | ICD-10-CM

## 2013-08-07 ENCOUNTER — Telehealth: Payer: Self-pay | Admitting: *Deleted

## 2013-08-07 NOTE — Telephone Encounter (Signed)
Clearance note faxed to [facility]

## 2013-08-07 NOTE — Telephone Encounter (Signed)
Ok to stop plavix five days prior to colonoscopy.  Restart as soon as possible depending on the results of the test.  Eric Ramos 4:16 PM

## 2013-09-09 ENCOUNTER — Encounter: Payer: Self-pay | Admitting: Cardiovascular Disease

## 2013-09-23 ENCOUNTER — Encounter: Payer: Self-pay | Admitting: Physician Assistant

## 2013-09-23 ENCOUNTER — Ambulatory Visit (INDEPENDENT_AMBULATORY_CARE_PROVIDER_SITE_OTHER): Payer: Federal, State, Local not specified - PPO | Admitting: Physician Assistant

## 2013-09-23 VITALS — BP 120/60 | HR 60 | Ht 72.0 in | Wt 174.0 lb

## 2013-09-23 DIAGNOSIS — R55 Syncope and collapse: Secondary | ICD-10-CM

## 2013-09-23 NOTE — Progress Notes (Signed)
Date:  09/23/2013   ID:  Eric Ramos, DOB [birth year], MRN [id number]  PCP:  Provider Not In System  Primary Cardiologist:  Allyson Sabal      History of Present Illness: Eric Ramos is a 52 y.o. male very fit appearing male with a history coronary disease, hypertension, hyperlipidemia. 09/16/2009 patient suffered an anterior wall MI received a drug-eluting stent to the proximal LAD. He had moderate disease in the right coronary artery with anterior apical hypokinesia. His last Myoview stress test was 12/13/2009 showed small anterior apical scar with no ischemia. 2-D echocardiogram was October 2010 showed ejection fraction of 50-55%   Patient was seen on une 15, 2014 at [facility] emergency room after having a syncopal episode while waiting for a table at [employer]. Patient stated he started feeling warm and seeing spots and was on his way to the bathroom when he had a syncopal event. He also noted some chills the night before. He reports some nausea and dizziness prior to the event. He also reports a similar episode approximately 2 years ago after having a fever. 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  Also reports more activity and lifting weights more frequently he also typically plays full court basketball on Thursday evenings. His father had liver cancer. Head CT at [facility] was negative for acute abnormality. Troponin was negative.  He was seen by Dr. Allyson Sabal  On June 24, 2013.  He's had one syncopal episode while having a skin lesion removed by dermatologist. A 30 day event monitor was ordered but are partially he took off during the procedure. A 2-D echo was normal.  Dr. Allyson Sabal counseled him about not driving for 6 months after a syncopal episode without etiology and he is aware of that.   His blood pressure is low today and therefore I am going to discontinue his ramipril.  He presents to for follow up.  He reports no further issues with  syncope.  The episode at the dermatologists office was triggered by an emotional conversation.  He rarely has mild dizziness when he bends over. Otherwise he denies nausea, vomiting, fever, chest pain, shortness of breath, orthopnea, PND, cough, congestion, abdominal pain, hematochezia, melena, lower extremity edema, claudication.  Wt Readings from Last 3 Encounters:  09/23/13 174 lb (78.926 kg)  06/24/13 168 lb 14.4 oz (76.613 kg)  05/25/13 169 lb 6.4 oz (76.839 kg)     Past Medical History  Diagnosis Date  . Myocardial infarction 09/16/2009    was cath by Dr Nicki Guadalajara revealing an occluded proximal LAD whenhe had Xiencie V drug- eluting stent placed there.  . Coronary artery disease   . History of stress test 12/2009    No significant demontrated. this a low risk scan. normal stress test, there is a mild scar in the LAD distribution without ischemia.  Marland Kitchen Hx of echocardiogram     showed a normal LV systolic function.  . Hypertension   . Hyperlipemia   . Syncope   . Unexplained weight loss     Current Outpatient Prescriptions  Medication Sig Dispense Refill  . aspirin 81 MG EC tablet Take 162 mg by mouth daily.       . clopidogrel (PLAVIX) 75 MG tablet Take 75 mg by mouth daily.      . metoprolol tartrate (LOPRESSOR) 25 MG tablet Take 25 mg by mouth 2 (two) times daily.       Marland Kitchen  rosuvastatin (CRESTOR) 40 MG tablet Take 40 mg by mouth daily.         No current facility-administered medications for this visit.    Allergies:   No Known Allergies  Social History:  The patient  reports that he has never smoked. He has never used smokeless tobacco. He reports that he drinks alcohol. He reports that he does not use illicit drugs.   Family history:   Family History  Problem Relation Age of Onset  . Heart attack Mother   . Cancer Father     liver    ROS:  Please see the history of present illness.  All other systems reviewed and negative.   PHYSICAL EXAM: VS:  BP 120/60   Pulse 60  Ht 6' (1.829 m)  Wt 174 lb (78.926 kg)  BMI 23.59 kg/m2 Well nourished, well developed, in no acute distress HEENT: Pupils are equal round react to light accommodation extraocular movements are intact.  Neck: no JVDNo cervical lymphadenopathy. Cardiac: Regular rate and rhythm without murmurs rubs or gallops. Lungs:  clear to auscultation bilaterally, no wheezing, rhonchi or rales Ext: no lower extremity edema.  2+ radial and dorsalis pedis pulses. Skin: warm and dry Neuro:  Grossly normal   ASSESSMENT AND PLAN:  Problem List Items Addressed This Visit   Syncope - Primary     No further episodes.  He has a very healthy lifestyle.  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  I discussed the possibility of implanting a loop recorder should he have further occurences.   Follow up in one year with Dr. Allyson Sabal.

## 2013-09-23 NOTE — Assessment & Plan Note (Signed)
No further episodes.  He has a very healthy lifestyle.  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  I discussed the possibility of implanting a loop recorder should he have further occurences.   Follow up in one year with Dr. Allyson Sabal.

## 2013-09-23 NOTE — Patient Instructions (Signed)
Follow up with Dr. Allyson Sabal in one year.  If you have any episodes on dizziness or feeling as though you are going to pass out, call for and immediate appt and we can schedule a loop recorder implant.

## 2013-10-15 ENCOUNTER — Other Ambulatory Visit: Payer: Self-pay

## 2014-01-15 ENCOUNTER — Ambulatory Visit: Payer: Federal, State, Local not specified - PPO | Admitting: Cardiovascular Disease

## 2014-02-23 ENCOUNTER — Encounter: Payer: Self-pay | Admitting: Cardiovascular Disease

## 2014-02-23 ENCOUNTER — Other Ambulatory Visit: Payer: Self-pay | Admitting: Cardiovascular Disease

## 2014-02-23 ENCOUNTER — Ambulatory Visit (INDEPENDENT_AMBULATORY_CARE_PROVIDER_SITE_OTHER): Payer: 59 | Admitting: Cardiovascular Disease

## 2014-02-23 VITALS — BP 130/86 | HR 54 | Ht 73.0 in | Wt 172.7 lb

## 2014-02-23 DIAGNOSIS — I2510 Atherosclerotic heart disease of native coronary artery without angina pectoris: Secondary | ICD-10-CM

## 2014-02-23 DIAGNOSIS — I10 Essential (primary) hypertension: Secondary | ICD-10-CM

## 2014-02-23 DIAGNOSIS — R55 Syncope and collapse: Secondary | ICD-10-CM

## 2014-02-23 DIAGNOSIS — E785 Hyperlipidemia, unspecified: Secondary | ICD-10-CM

## 2014-02-23 DIAGNOSIS — Z79899 Other long term (current) drug therapy: Secondary | ICD-10-CM

## 2014-02-23 NOTE — Progress Notes (Signed)
02/23/2014 Eric Ramos   1961/05/06  [id number]  Primary Physician PROVIDER NOT IN SYSTEM Primary Cardiologist: Eric GessJonathan J. Wynema Garoutte MD [place]ACP,FACC,FAHA, [place]   HPI:  Eric Ramos is a 53y.o. very fit appearing male with a history coronary disease, hypertension, hyperlipidemia. 09/16/2009 patient suffered an anterior wall MI received a drug-eluting stent to the proximal LAD. He had moderate disease in the right coronary artery with anterior apical hypokinesia. His last Myoview stress test was 12/13/2009 showed small anterior apical scar with no ischemia. 2-D echocardiogram was October 2010 showed ejection fraction of 50-55%  Patient [facility] after having a syncopal episode while waiting for a table at [employer]. Patient states he started feeling warm and seeing spots and was on his way to the bathroom when he had a syncopal event. He also noted some chills the night before. He reports some nausea and dizziness prior to the event. He also reports a similar episode approximately 2 years ago after having a fever. 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 Also reports a decrease in weight of approximately 10-13 pounds in the last 2 months. Also reports more activity and lifting weights more frequently he also typically plays full court basketball on Thursday evenings. His father had liver cancer. [facility] for acute abnormality. Troponin was negative. Since he was seen by Eric BienenstockBrian Hager PA-C the past one month ago he's had one syncopal episode while having a skin lesion removed by dermatologist. A 30 day event monitor was ordered but are partially he took off during the procedure. A 2-D echo was normal. I have counseled him about not driving for 6 months after a syncopal episode without etiology and he is aware of that. 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 He saw Eric DunkerRyan Ramos [facility] in the office 09/23/13 and has  been doing well since. He is otherwise asymptomatic and has had no further syncopal episodes.   Current Outpatient Prescriptions  Medication Sig Dispense Refill  . aspirin 81 MG EC tablet Take 162 mg by mouth daily.       . clopidogrel (PLAVIX) 75 MG tablet Take 75 mg by mouth daily.      . metoprolol tartrate (LOPRESSOR) 25 MG tablet Take 25 mg by mouth 2 (two) times daily.       . rosuvastatin (CRESTOR) 40 MG tablet Take 40 mg by mouth daily.         No current facility-administered medications for this visit.    No Known Allergies  History   Social History  . Marital Status: Divorced    Spouse Name: N/A    Number of Children: N/A  . Years of Education: N/A   Occupational History  . Not on file.   Social History Main Topics  . Smoking status: Never Smoker   . Smokeless tobacco: Never Used  . Alcohol Use: Yes     Comment: some alcohol  . Drug Use: No  . Sexual Activity: Not on file   Other Topics Concern  . Not on file   Social History Narrative  . No narrative on file     Review of Systems: General: negative for chills, fever, night sweats or weight changes.  Cardiovascular: negative for chest pain, dyspnea on exertion, edema, orthopnea, palpitations, paroxysmal nocturnal dyspnea or shortness of breath Dermatological: negative for rash Respiratory: negative for cough or wheezing Urologic: negative  for hematuria Abdominal: negative for nausea, vomiting, diarrhea, bright red blood per rectum, melena, or hematemesis Neurologic: negative for visual changes, syncope, or dizziness All other systems reviewed and are otherwise negative except as noted above.    Blood pressure 130/86, pulse 54, height 6\' 1"  (1.854 m), weight 172 lb 11.2 oz (78.336 kg).  General appearance: alert and no distress Neck: no adenopathy, no carotid bruit, no JVD, supple, symmetrical, trachea midline and thyroid not enlarged, symmetric, no tenderness/mass/nodules Lungs: clear to auscultation  bilaterally Heart: regular rate and rhythm, S1, S2 normal, no murmur, click, rub or gallop Extremities: extremities normal, atraumatic, no cyanosis or edema  EKG sinus bradycardia at 54 without ST or T wave changes  ASSESSMENT AND PLAN:   Coronary artery disease History of CAD status post intralocular cardioversion 09/16/09 to a drug-eluting stent the proximal LAD. He is moderate disease of the RCA and an anteroapical hypokinesia. His last Myoview performed 1//11 showed a small anteroapical scar with no ischemia. He denies chest pain or shortness of breath.  Essential hypertension Controlled on current medications  Hyperlipidemia On statin therapy followed by his PCP. I'm going to recheck a lipid and liver profile  Syncope He has had no further syncopal episodes.      Eric Gess MD FACP,FACC,FAHA, [facility] 02/23/2014 2:44 PM

## 2014-02-23 NOTE — Assessment & Plan Note (Signed)
On statin therapy followed by his PCP. I'm going to recheck a lipid and liver profile

## 2014-02-23 NOTE — Patient Instructions (Signed)
  We will see you back in follow up in 1 year with Dr Berry  Dr Berry has ordered blood work to be done, fasting   

## 2014-02-23 NOTE — Assessment & Plan Note (Signed)
He has had no further syncopal episodes. 

## 2014-02-23 NOTE — Assessment & Plan Note (Signed)
Controlled on current medications 

## 2014-02-23 NOTE — Assessment & Plan Note (Signed)
History of CAD status post intralocular cardioversion 09/16/09 to a drug-eluting stent the proximal LAD. He is moderate disease of the RCA and an anteroapical hypokinesia. His last Myoview performed 1//11 showed a small anteroapical scar with no ischemia. He denies chest pain or shortness of breath.

## 2014-06-18 ENCOUNTER — Other Ambulatory Visit: Payer: Self-pay | Admitting: *Deleted

## 2014-06-18 MED ORDER — CLOPIDOGREL BISULFATE 75 MG PO TABS: 75.0000 mg | ORAL_TABLET | Freq: Once | ORAL | Status: DC

## 2014-06-18 NOTE — Telephone Encounter (Signed)
Rx refill sent to patient pharmacy   

## 2014-09-23 ENCOUNTER — Encounter: Payer: Self-pay | Admitting: Physician Assistant

## 2014-09-23 ENCOUNTER — Telehealth: Payer: Self-pay | Admitting: Cardiovascular Disease

## 2014-09-23 ENCOUNTER — Ambulatory Visit (INDEPENDENT_AMBULATORY_CARE_PROVIDER_SITE_OTHER): Payer: 59 | Admitting: Physician Assistant

## 2014-09-23 VITALS — BP 112/76 | HR 46 | Ht 72.0 in | Wt 180.0 lb

## 2014-09-23 DIAGNOSIS — I471 Supraventricular tachycardia: Secondary | ICD-10-CM

## 2014-09-23 DIAGNOSIS — E785 Hyperlipidemia, unspecified: Secondary | ICD-10-CM

## 2014-09-23 DIAGNOSIS — T733XXA Exhaustion due to excessive exertion, initial encounter: Secondary | ICD-10-CM

## 2014-09-23 DIAGNOSIS — I472 Ventricular tachycardia: Secondary | ICD-10-CM | POA: Insufficient documentation

## 2014-09-23 DIAGNOSIS — I10 Essential (primary) hypertension: Secondary | ICD-10-CM

## 2014-09-23 DIAGNOSIS — R002 Palpitations: Secondary | ICD-10-CM

## 2014-09-23 DIAGNOSIS — I4729 Other ventricular tachycardia: Secondary | ICD-10-CM | POA: Insufficient documentation

## 2014-09-23 DIAGNOSIS — Z87898 Personal history of other specified conditions: Secondary | ICD-10-CM

## 2014-09-23 DIAGNOSIS — I2510 Atherosclerotic heart disease of native coronary artery without angina pectoris: Secondary | ICD-10-CM

## 2014-09-23 DIAGNOSIS — R001 Bradycardia, unspecified: Secondary | ICD-10-CM | POA: Insufficient documentation

## 2014-09-23 DIAGNOSIS — Z9189 Other specified personal risk factors, not elsewhere classified: Secondary | ICD-10-CM

## 2014-09-23 DIAGNOSIS — R5383 Other fatigue: Secondary | ICD-10-CM

## 2014-09-23 LAB — BASIC METABOLIC PANEL
BUN: 21 mg/dL (ref 6–23)
CALCIUM: 9.1 mg/dL (ref 8.4–10.5)
CO2: 27 mEq/L (ref 19–32)
Chloride: 104 mEq/L (ref 96–112)
Creatinine, Ser: 1 mg/dL (ref 0.4–1.5)
GFR: 82.23 mL/min (ref >60.00)
GLUCOSE: 104 mg/dL — AB (ref 70–99)
Potassium: 4.4 mEq/L (ref 3.5–5.1)
SODIUM: 139 meq/L (ref 135–145)

## 2014-09-23 LAB — CBC
HCT: 45.6 % (ref 39.0–52.0)
Hemoglobin: 15 g/dL (ref 13.0–17.0)
MCHC: 32.8 g/dL (ref 30.0–36.0)
MCV: 94.5 fl (ref 78.0–100.0)
Platelets: 172 10*3/uL (ref 150.0–400.0)
RBC: 4.83 Mil/uL (ref 4.22–5.81)
RDW: 13.1 % (ref 11.5–15.5)
WBC: 6 10*3/uL (ref 4.0–10.5)

## 2014-09-23 LAB — TSH: TSH: 2.66 u[IU]/mL (ref 0.35–4.50)

## 2014-09-23 NOTE — Patient Instructions (Signed)
Your physician has recommended you make the following change in your medication:    1. Start taking 12.5 mg of Metoprolol twice a day     Your physician recommends that you return for lab work  Today  BMET  CBC TSH    Your physician has requested that you have an echocardiogram. Echocardiography is a painless test that uses sound waves to create images of your heart. It provides your doctor with information about the size and shape of your heart and how well your heart's chambers and valves are working. This procedure takes approximately one hour. There are no restrictions for this procedure.    Your physician recommends that you schedule a follow-up appointment in:  3 WEEKS WITH DR Allyson SabalBERRY

## 2014-09-23 NOTE — Telephone Encounter (Signed)
Melissa called in stating that pt is currently in their office and has been having palpitations all morning and his pulse rate has been recorded at 44. Melissa would like to know if the pt can be seen today. Please call back  Thanks

## 2014-09-23 NOTE — Addendum Note (Signed)
Addended by: Laurann Montana[place]N, DAYNA N on: 09/23/2014 12:19 PM   Modules accepted: Level of Service

## 2014-09-23 NOTE — Progress Notes (Addendum)
[address] [place], [place]  [address] Phone: [phone]) [phone] Fax:  [phone]  Date:  09/23/2014   Patient ID:  Eric Ramos, DOB 15-Mar-1961, MRN [id number]   PCP:  PROVIDER NOT IN SYSTEM  Cardiologist:  Allyson SabalBerry   History of Present Illness: Eric Ramos is a 53 y.o. male with history of CAD s/p anterior STEMI 2010 s/p DES to LAD (moderate residual RCA disease), normal LV function, HTN, HL, prior syncope who presents as a work-in visit today to [facility]. During his 2010 admission for STEMI he was noted to have bradycardia with HR into the 40's at times but was felt to be tolerating this, thus metoprolol was continued. Last nuclear stress test in 2011 showed small anteroapical scar but no ischemia. He was evaluated in 2014 for an episode of syncope while walking to bathroom at [employer]. Event monitor was ordered but per notes the patient did finish wearing this - it did show NSR, sinus arrhythmia, one PVC, and 7 beats of PSVT per report. ACEI was d/c'd circumferential to that event as his blood pressure was noted to be soft. He has not had any syncope recurrences - consideration was given to ILR if it does.  He went to [facility] today for evaluation of palpitations. The night before last, he had a feeling of a "skipped beat" then brief pause and resumption of normal heart rhythm. Last night he had about 5 minutes of sensation of racing heart beat (he thinks about 90bpm or a little higher) without any other associated symptoms. 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 He also plays basketball for fun. He has not had any anginal symptoms. He does notice that lately he has less "pep" though with slightly decreased energy.   Recent Labs: No results found for requested labs within last 365 days.  Wt Readings from Last 3 Encounters:  09/23/14 180 lb (81.647 kg)  02/23/14 172 lb 11.2 oz (78.336 kg)  09/23/13 174 lb (78.926 kg)     Past Medical  History  Diagnosis Date  . Coronary artery disease     a. STEMI 09/2009 s/p PTCA/DES to LAD, residual moderate RCA disease (50-60%). b. Low risk stress test 2011 - scar but no ischemia.  Marland Kitchen. Hx of echocardiogram     a. EF 50-55% at time of STEMI 2010. b. Echo 05/2013: EF 55-60%, normal wall motion, normal echo.  . Hypertension   . Hyperlipemia   . Syncope     a. Prior episode while having skin lesion removed by dermatologist. b. Recurrence in 2014 while walking to bathroom at [employer]: event monitor ordered but patient only partially wore; BP soft so ACEI d/c'd.   . Unexplained weight loss   . Bradycardia     a. Sinus bradycardia in the 40's during 2010 admission.  Marland Kitchen. NSVT (nonsustained ventricular tachycardia)     a. During 2010 admission for STEMI.   . SVT (supraventricular tachycardia)     a. 7 beats PSVT by event monitor in 2014.    Current Outpatient Prescriptions  Medication Sig Dispense Refill  . aspirin 81 MG EC tablet Take 162 mg by mouth daily.       . clopidogrel (PLAVIX) 75 MG tablet Take 1 tablet (75 mg total) by mouth once.  90 tablet  2  . metoprolol tartrate (LOPRESSOR) 25 MG tablet Take 12.5 mg by mouth 2 (two) times daily.       .Marland Kitchen  rosuvastatin (CRESTOR) 40 MG tablet Take 40 mg by mouth daily.         No current facility-administered medications for this visit.    Allergies:   Review of patient's allergies indicates no known allergies.   Social History:  The patient  reports that he has never smoked. He has never used smokeless tobacco. He reports that he drinks alcohol. He reports that he does not use illicit drugs.   Family History:  The patient's family history includes Cancer in his father; Heart attack in his mother.  ROS:  Please see the history of present illness. All other systems reviewed and negative.   PHYSICAL EXAM:  VS:  BP 112/76  Pulse 46  Ht 6' (1.829 m)  Wt 180 lb (81.647 kg)  BMI 24.41 kg/m2  SpO2 98% Well nourished, well developed WM,  in no acute distress HEENT: normal Neck: no JVD Cardiac:  normal S1, S2; reg rhythm, bradycardic; no murmur Lungs:  clear to auscultation bilaterally, no wheezing, rhonchi or rales Abd: soft, nontender, no hepatomegaly Ext: no edema Skin: warm and dry Neuro:  moves all extremities spontaneously, no focal abnormalities noted  EKG:  Sinus bradycardia 44bpm no acute changes, no ectopy, PR 143ms, QRS 109ms, QTc 446  ASSESSMENT AND PLAN:  1. Palpitations - first episode sounds like a PVC or PAC. The second episode could be some sort of SVT. His event monitor in 2014 showed 7 beats of this. With ~5 minutes of palpitations and no other symptoms, I am less concerned for ventricular arrhythmia. However, I would like to obtain echocardiogram to make sure LV function has remained normal. Check basic labs. I offered him event monitoring to exclude AF but he declined. We will observe for recurrences, particularly on lower dose of BB as below. If this does turn out to be SVT would consider him for ablation given inability to titrate BB due to HR. Warning signs discussed and he verbalized understanding. 2. Sinus bradycardia - suspect this may be contributing to some of his fatigue. I discussed options with the patient including leaving metoprolol as is (given recent palpitations) vs decreasing metoprolol to 12.5mg  BID. He elected the latter. Will also check BMET, CBC, TSH given palps/fatigue. 3. CAD s/p MI/PCI 2010 - no recent anginal symptoms. He remains very active. 4. HTN - controlled. 5. Hyperlipidemia - continue statin. 6. H/o syncope - no further recurrence.   Dispo: F/u 3 months with Dr. Allyson SabalBerry.  Signed, Ronie Spiesayna Dunn, PA-C  09/23/2014 11:08 AM

## 2014-09-23 NOTE — Telephone Encounter (Signed)
Returned call to General MillsMelissa with [facility] at [place].She stated patient was seen there this morning with frequent palpitations and pulse 44.Stated they would like patient seen today.Appointment scheduled with Lucile Craterana Dunn PA at our [facility] office this morning at 10:00 am.

## 2014-09-24 ENCOUNTER — Telehealth: Payer: Self-pay | Admitting: *Deleted

## 2014-09-24 NOTE — Telephone Encounter (Signed)
pt notified about lab results and recommendations to f/u w/PCP about glucose. Pt verbalized understanding to Plan of Care

## 2014-09-28 ENCOUNTER — Ambulatory Visit (HOSPITAL_COMMUNITY): Payer: 59 | Attending: Cardiovascular Disease

## 2014-09-28 DIAGNOSIS — R002 Palpitations: Secondary | ICD-10-CM | POA: Diagnosis not present

## 2014-09-28 DIAGNOSIS — E785 Hyperlipidemia, unspecified: Secondary | ICD-10-CM | POA: Diagnosis not present

## 2014-09-28 DIAGNOSIS — I10 Essential (primary) hypertension: Secondary | ICD-10-CM | POA: Diagnosis not present

## 2014-09-28 NOTE — Progress Notes (Signed)
2D Echo completed. 09/28/2014 

## 2014-09-29 ENCOUNTER — Telehealth: Payer: Self-pay | Admitting: *Deleted

## 2014-09-29 NOTE — Telephone Encounter (Signed)
pt notfied about echo results with verbal understanding

## 2014-09-29 NOTE — Telephone Encounter (Signed)
lmptcb for echo results 

## 2014-09-29 NOTE — Telephone Encounter (Signed)
Follow up ° ° ° ° ° °Returning [name]'s call °

## 2014-11-03 ENCOUNTER — Encounter: Payer: Self-pay | Admitting: Cardiovascular Disease

## 2014-11-03 ENCOUNTER — Ambulatory Visit (INDEPENDENT_AMBULATORY_CARE_PROVIDER_SITE_OTHER): Payer: 59 | Admitting: Cardiovascular Disease

## 2014-11-03 VITALS — BP 124/64 | HR 72 | Ht 70.0 in | Wt 184.0 lb

## 2014-11-03 DIAGNOSIS — I10 Essential (primary) hypertension: Secondary | ICD-10-CM

## 2014-11-03 DIAGNOSIS — I2510 Atherosclerotic heart disease of native coronary artery without angina pectoris: Secondary | ICD-10-CM

## 2014-11-03 DIAGNOSIS — E785 Hyperlipidemia, unspecified: Secondary | ICD-10-CM

## 2014-11-03 DIAGNOSIS — Z79899 Other long term (current) drug therapy: Secondary | ICD-10-CM

## 2014-11-03 DIAGNOSIS — I2583 Coronary atherosclerosis due to lipid rich plaque: Secondary | ICD-10-CM

## 2014-11-03 NOTE — Assessment & Plan Note (Signed)
History of hyperlipidemia on Crestor 40 mg daily. We will recheck a lipid and liver profile. 

## 2014-11-03 NOTE — Progress Notes (Signed)
11/03/2014 Eric Ramos   [birth year]  [id number]  Primary Physician PROVIDER NOT IN SYSTEM Primary Cardiologist: Runell GessJonathan J. Dahiana Kulak MD [place],FACC,FAHA, [place]   HPI:  Eric Ramos is a 53y.o. very fit appearing male with a history coronary disease, hypertension, hyperlipidemia. 09/16/2009 patient suffered an anterior wall MI received a drug-eluting stent to the proximal LAD. He had moderate disease in the right coronary artery with anterior apical hypokinesia. His last Myoview stress test was 12/13/2009 showed small anterior apical scar with no ischemia. 2-D echocardiogram was October 2010 showed ejection fraction of 50-55%  Patient was [facility] yesterday at [facility] room after having a syncopal episode while waiting for a table at [employer]. Patient states he started feeling warm and seeing spots and was on his way to the bathroom when he had a syncopal event. He also noted some chills the night before. He reports some nausea and dizziness prior to the event. He also reports a similar episode approximately 2 years ago after having a fever. 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 Also reports a decrease in weight of approximately 10-13 pounds in the last 2 months. Also reports more activity and lifting weights more frequently he also typically plays full court basketball on Thursday evenings. His father had liver cancer. Head [facility] was negative for acute abnormality. Troponin was negative. Since he was seen by Huey BienenstockBrian Hager PA-C the past one month ago he's had one syncopal episode while having a skin lesion removed by dermatologist. A 30 day event monitor was ordered but are partially he took off during the procedure. A 2-D echo was normal. I have counseled him about not driving for 6 months after a syncopal episode without etiology and he is aware of that. 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 He was having some palpitations and recently had a 2-D echocardiogram performed 09/28/14 which was entirely normal.    Current Outpatient Prescriptions  Medication Sig Dispense Refill  . aspirin 81 MG EC tablet Take 162 mg by mouth daily.     . clopidogrel (PLAVIX) 75 MG tablet Take 1 tablet (75 mg total) by mouth once. 90 tablet 2  . fluorouracil (EFUDEX) 5 % cream as needed.    . metoprolol tartrate (LOPRESSOR) 25 MG tablet Take 25 mg by mouth daily.     . rosuvastatin (CRESTOR) 40 MG tablet Take 40 mg by mouth daily.       No current facility-administered medications for this visit.    No Known Allergies  History   Social History  . Marital Status: Divorced    Spouse Name: N/A    Number of Children: N/A  . Years of Education: N/A   Occupational History  . Not on file.   Social History Main Topics  . Smoking status: Never Smoker   . Smokeless tobacco: Never Used  . Alcohol Use: Yes     Comment: some alcohol  . Drug Use: No  . Sexual Activity: Not on file   Other Topics Concern  . Not on file   Social History Narrative     Review of Systems: General: negative for chills, fever, night sweats or weight changes.  Cardiovascular: negative for chest pain, dyspnea on exertion, edema, orthopnea, palpitations, paroxysmal nocturnal dyspnea or shortness of  breath Dermatological: negative for rash Respiratory: negative for cough or wheezing Urologic: negative for hematuria Abdominal: negative for nausea, vomiting, diarrhea, bright red blood per rectum, melena, or hematemesis Neurologic: negative for visual changes, syncope, or dizziness All other systems reviewed and are otherwise negative except as noted above.    Blood pressure 124/64, pulse 72, height 5\' 10"  (1.778 m), weight 184 lb (83.462 kg).  General appearance: alert and no distress Neck: no adenopathy, no carotid bruit, no  JVD, supple, symmetrical, trachea midline and thyroid not enlarged, symmetric, no tenderness/mass/nodules Lungs: clear to auscultation bilaterally Heart: regular rate and rhythm, S1, S2 normal, no murmur, click, rub or gallop Extremities: extremities normal, atraumatic, no cyanosis or edema  EKG performed today  ASSESSMENT AND PLAN:   Coronary artery disease History of CAD status post anterior wall myocardial infarction 09/16/09 free with drug alluding stent to the proximal LAD. He had moderate disease in his right coronary artery with an anteroapical wall motion abnormality. Myoview performed 12/13/09 showed a small anteroapical scar with no ischemia and 2-D echocardiogram recently performed on 09/28/14 revealed an ejection fraction of 55-60% without regional wall motion abnormalities. He denies chest pain or shortness of breath.  Syncope He has had syncopal episodes in the past but none since I saw him back in March  Essential hypertension History of hypertension with blood pressure measured today 124/64. He is on metoprolol 25 mg a day. Continue current medications  Hyperlipidemia History of hyperlipidemia on Crestor 40 mg daily. We will recheck a lipid and liver profile      Runell GessJonathan J. Paxten Appelt MD [facility]ACP,FACC,FAHA, [facility]SCAI 11/03/2014 4:13 PM

## 2014-11-03 NOTE — Patient Instructions (Signed)
  We will see you back in follow up in 1 year with Dr Berry.   Dr Berry has ordered: Your physician recommends that you return for a FASTING lipid profile    

## 2014-11-03 NOTE — Assessment & Plan Note (Signed)
He has had syncopal episodes in the past but none since I saw him back in March

## 2014-11-03 NOTE — Assessment & Plan Note (Signed)
History of CAD status post anterior wall myocardial infarction 09/16/09 free with drug alluding stent to the proximal LAD. He had moderate disease in his right coronary artery with an anteroapical wall motion abnormality. Myoview performed 12/13/09 showed a small anteroapical scar with no ischemia and 2-D echocardiogram recently performed on 09/28/14 revealed an ejection fraction of 55-60% without regional wall motion abnormalities. He denies chest pain or shortness of breath.

## 2014-11-03 NOTE — Assessment & Plan Note (Signed)
History of hypertension with blood pressure measured today 124/64. He is on metoprolol 25 mg a day. Continue current medications

## 2015-01-12 ENCOUNTER — Telehealth: Payer: Self-pay | Admitting: Cardiovascular Disease

## 2015-01-12 MED ORDER — ROSUVASTATIN CALCIUM 40 MG PO TABS: 40.0000 mg | ORAL_TABLET | Freq: Every day | ORAL | Status: DC

## 2015-01-12 MED ORDER — CLOPIDOGREL BISULFATE 75 MG PO TABS: 75.0000 mg | ORAL_TABLET | Freq: Once | ORAL | Status: DC

## 2015-01-12 MED ORDER — METOPROLOL TARTRATE 25 MG PO TABS: 25.0000 mg | ORAL_TABLET | Freq: Every day | ORAL | Status: DC

## 2015-01-12 NOTE — Telephone Encounter (Signed)
Spoke with pt, aware scripts sent to the pharmacy.

## 2015-01-12 NOTE — Telephone Encounter (Signed)
°  1. Which medications need to be refilled? Crestor, Plavix, Metropolol- they need a new prescription for these because he switched pharmacy   2. Which pharmacy is medication to be sent to?[facility] on [address] *  3. Do they need a 30 day or 90 day supply? 30  4. Would they like a call back once the medication has been sent to the pharmacy? yes

## 2015-06-01 ENCOUNTER — Ambulatory Visit: Payer: 59 | Admitting: Cardiovascular Disease

## 2015-06-28 ENCOUNTER — Encounter: Payer: Self-pay | Admitting: Cardiovascular Disease

## 2015-06-28 ENCOUNTER — Ambulatory Visit (INDEPENDENT_AMBULATORY_CARE_PROVIDER_SITE_OTHER): Payer: Federal, State, Local not specified - PPO | Admitting: Cardiovascular Disease

## 2015-06-28 VITALS — BP 112/82 | HR 52 | Ht 72.0 in | Wt 180.9 lb

## 2015-06-28 DIAGNOSIS — Z79899 Other long term (current) drug therapy: Secondary | ICD-10-CM | POA: Diagnosis not present

## 2015-06-28 DIAGNOSIS — E785 Hyperlipidemia, unspecified: Secondary | ICD-10-CM | POA: Diagnosis not present

## 2015-06-28 DIAGNOSIS — I2510 Atherosclerotic heart disease of native coronary artery without angina pectoris: Secondary | ICD-10-CM | POA: Diagnosis not present

## 2015-06-28 DIAGNOSIS — I10 Essential (primary) hypertension: Secondary | ICD-10-CM | POA: Diagnosis not present

## 2015-06-28 DIAGNOSIS — I2583 Coronary atherosclerosis due to lipid rich plaque: Secondary | ICD-10-CM

## 2015-06-28 NOTE — Assessment & Plan Note (Signed)
History of hypertension blood pressure measured at 112/82. He is on metoprolol. Continue current meds at current dosing

## 2015-06-28 NOTE — Assessment & Plan Note (Signed)
History of CAD status post anterior wall Monocryl infarction treated with drug eluding stent 09/16/09 to his proximal LAD. He had moderate disease in the right coronary artery with an anteroapical wall motion abnormality. A Myoview stress test performed 12/13/09 showed a small anteroapical scar with no ischemia. His most recent 2-D echo performed 09/28/14 revealed normal LV systolic function. He denies chest pain or shortness of breath.

## 2015-06-28 NOTE — Progress Notes (Signed)
06/28/2015 Eric Ramos   1961/02/22  [id number]  Primary Physician PROVIDER NOT IN SYSTEM Primary Cardiologist: Runell GessJonathan J. Elih Mooney MD [place]ACP,FACC,FAHA, [place]   HPI:  Eric Ramos is a 89.o. very fit appearing male with a history coronary disease, hypertension, hyperlipidemia. I last saw him in the office 11/03/14. On 09/16/2009 patient suffered an anterior wall MI received a drug-eluting stent to the proximal LAD. He had moderate disease in the right coronary artery with anterior apical hypokinesia. His last Myoview stress test was 12/13/2009 showed small anterior apical scar with no ischemia. 2-D echocardiogram was October 2010 showed ejection fraction of 50-55%  [facility] after having a syncopal episode while waiting for a table at [employer]. Patient states he started feeling warm and seeing spots and was on his way to the bathroom when he had a syncopal event. He also noted some chills the night before. He reports some nausea and dizziness prior to the event. He also reports a similar episode approximately 2 years ago after having a fever. 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 Also reports a decrease in weight of approximately 10-13 pounds in the last 2 months. Also reports more activity and lifting weights more frequently he also typically plays full court basketball on Thursday evenings. His father had liver cancer. Head [facility] was negative for acute abnormality. Troponin was negative. Since he was seen by Huey BienenstockBrian Hager PA-C the past one month ago he's had one syncopal episode while having a skin lesion removed by dermatologist. A 30 day event monitor was ordered but are partially he took off during the procedure. A 2-D echo was normal. I have counseled him about not driving for 6 months after a syncopal episode without etiology and he is aware of that. 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  A recent 2-D echocardiogram performed 09/28/14 which was entirely normal.   Current Outpatient Prescriptions  Medication Sig Dispense Refill  . aspirin 81 MG EC tablet Take 162 mg by mouth daily.     . clopidogrel (PLAVIX) 75 MG tablet Take 1 tablet (75 mg total) by mouth once. 30 tablet 12  . metoprolol tartrate (LOPRESSOR) 25 MG tablet Take 1 tablet (25 mg total) by mouth daily. 30 tablet 12  . rosuvastatin (CRESTOR) 40 MG tablet Take 1 tablet (40 mg total) by mouth daily. 30 tablet 12   No current facility-administered medications for this visit.    No Known Allergies  History   Social History  . Marital Status: Divorced    Spouse Name: N/A  . Number of Children: N/A  . Years of Education: N/A   Occupational History  . Not on file.   Social History Main Topics  . Smoking status: Never Smoker   . Smokeless tobacco: Never Used  . Alcohol Use: Yes     Comment: some alcohol  . Drug Use: No  . Sexual Activity: Not on file   Other Topics Concern  . Not on file   Social History Narrative     Review of Systems: General: negative for chills, fever, night sweats or weight changes.  Cardiovascular: negative for chest pain, dyspnea on exertion, edema, orthopnea, palpitations, paroxysmal nocturnal dyspnea or shortness of breath Dermatological: negative for rash  Respiratory: negative for cough or wheezing Urologic: negative for hematuria Abdominal: negative for nausea, vomiting, diarrhea, bright red blood per rectum, melena, or hematemesis Neurologic: negative for visual changes, syncope, or dizziness All other systems reviewed and are otherwise negative except as noted above.    Blood pressure 112/82, pulse 52, height 6' (1.829 m), weight 180 lb 14.4 oz (82.056 kg).  General appearance: alert and no distress Neck: no adenopathy, no carotid bruit,  no JVD, supple, symmetrical, trachea midline and thyroid not enlarged, symmetric, no tenderness/mass/nodules Lungs: clear to auscultation bilaterally Heart: regular rate and rhythm, S1, S2 normal, no murmur, click, rub or gallop Extremities: extremities normal, atraumatic, no cyanosis or edema  EKG sinus bradycardia of 52 with early repolarization changes. I personally reviewed this EKG  ASSESSMENT AND PLAN:   Hyperlipidemia History of hyperlipidemia on Crestor 40 mg a day. We'll we will recheck a lipid and liver profile  Essential hypertension History of hypertension blood pressure measured at 112/82. He is on metoprolol. Continue current meds at current dosing  Coronary artery disease History of CAD status post anterior wall Monocryl infarction treated with drug eluding stent 09/16/09 to his proximal LAD. He had moderate disease in the right coronary artery with an anteroapical wall motion abnormality. A Myoview stress test performed 12/13/09 showed a small anteroapical scar with no ischemia. His most recent 2-D echo performed 09/28/14 revealed normal LV systolic function. He denies chest pain or shortness of breath.      Runell Gess MD FACP,FACC,FAHA, [facility] 06/28/2015 11:03 AM

## 2015-06-28 NOTE — Assessment & Plan Note (Signed)
History of hyperlipidemia on Crestor 40 mg a day. We'll we will recheck a lipid and liver profile

## 2015-06-28 NOTE — Patient Instructions (Signed)
  We will see you back in follow up in 1 year with an extender and 2 years with Dr Allyson SabalBerry.   Dr Allyson SabalBerry has ordered: A FASTING lipid profile: to be done at your convenience.  [facility] lab on the first floor of this building, [address].  They are open from 8am-5pm with a lunch from 12-2.  You do not need an appointment.

## 2016-03-06 ENCOUNTER — Other Ambulatory Visit: Payer: Self-pay | Admitting: Cardiovascular Disease

## 2016-03-06 NOTE — Telephone Encounter (Signed)
REFILL 

## 2016-07-06 ENCOUNTER — Ambulatory Visit (INDEPENDENT_AMBULATORY_CARE_PROVIDER_SITE_OTHER): Payer: Federal, State, Local not specified - PPO | Admitting: Cardiology

## 2016-07-06 ENCOUNTER — Encounter: Payer: Self-pay | Admitting: Cardiology

## 2016-07-06 VITALS — BP 120/70 | HR 66 | Ht 72.0 in | Wt 169.0 lb

## 2016-07-06 DIAGNOSIS — I10 Essential (primary) hypertension: Secondary | ICD-10-CM | POA: Diagnosis not present

## 2016-07-06 DIAGNOSIS — I2510 Atherosclerotic heart disease of native coronary artery without angina pectoris: Secondary | ICD-10-CM

## 2016-07-06 DIAGNOSIS — E785 Hyperlipidemia, unspecified: Secondary | ICD-10-CM

## 2016-07-06 NOTE — Patient Instructions (Signed)
Your physician recommends that you return for lab work fasting.   Your physician wants you to follow-up in: 6 months or sooner if needed with Dr Allyson Sabal. You will receive a reminder letter in the mail two months in advance. If you don't receive a letter, please call our office to schedule the follow-up appointment.

## 2016-07-06 NOTE — Progress Notes (Signed)
    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 He is here for a one year f/u. He denies any chest pain or unusual dyspnea. He has had no problems with his medications.    Current Outpatient Prescriptions  Medication Sig Dispense Refill  . aspirin 81 MG EC tablet Take 162 mg by mouth daily.     . clopidogrel (PLAVIX) 75 MG tablet TAKE 1 TABLET BY MOUTH DAILY 30 tablet 12  . metoprolol tartrate (LOPRESSOR) 25 MG tablet TAKE 1 TABLET BY MOUTH DAILY 30 tablet 12  . rosuvastatin (CRESTOR) 40 MG tablet TAKE 1 TABLET BY MOUTH DAILY 30 tablet 12   No current facility-administered medications for this visit.     No Known Allergies  Social History   Social History  . Marital status: Divorced    Spouse name: N/A  . Number of children: N/A  . Years of education: N/A   Occupational History  . Not on file.   Social History Main Topics  . Smoking status: Never Smoker  . Smokeless tobacco: Never Used  . Alcohol use Yes     Comment: some alcohol  . Drug use: No  . Sexual activity: Not on file   Other Topics Concern  . Not on file   Social History Narrative  . No narrative on file     Review of Systems: General: negative for chills, fever, night sweats or weight changes.  Cardiovascular: negative for chest pain, dyspnea on exertion, edema, orthopnea, palpitations, paroxysmal nocturnal dyspnea or shortness of breath Dermatological: negative for rash Respiratory: negative for cough or wheezing Urologic: negative for hematuria Abdominal: negative for nausea, vomiting, diarrhea, bright red blood per rectum, melena, or hematemesis Neurologic: negative for visual changes, syncope, or dizziness All other systems reviewed and are otherwise negative except as noted above.    Blood pressure 120/70, pulse 66,  height 6' (1.829 m), weight 169 lb (76.7 kg).  General appearance: alert, cooperative and no distress Neck: no carotid bruit and no JVD Lungs: clear to auscultation bilaterally Heart: regular rate and rhythm Extremities: extremities normal, atraumatic, no cyanosis or edema Skin: Skin color, texture, turgor normal. No rashes or lesions Neurologic: Grossly normal  EKG NSR  ASSESSMENT AND PLAN:   Chronic coronary artery disease History of CAD status post anterior wall Monocryl infarction treated with drug eluding stent 09/16/09 to his proximal LAD. He had moderate disease in the right coronary artery with an anteroapical wall motion abnormality. A Myoview stress test performed 12/13/09 showed a small anteroapical scar with no ischemia. His most recent 2-D echo performed 09/28/14 revealed normal LV systolic function. He denies chest pain or shortness of breath.  Essential hypertension Controlled  Hyperlipidemia On high dose statin, due for lipids and CMET   PLAN  Same Rx, check lipids and CMET, f/u Dr Allyson Sabal one year.   Corine Shelter PA-C 07/06/2016 12:01 PM

## 2016-07-06 NOTE — Assessment & Plan Note (Signed)
On high dose statin, due for lipids and CMET

## 2016-07-06 NOTE — Assessment & Plan Note (Signed)
History of CAD status post anterior wall Monocryl infarction treated with drug eluding stent 09/16/09 to his proximal LAD. He had moderate disease in the right coronary artery with an anteroapical wall motion abnormality. A Myoview stress test performed 12/13/09 showed a small anteroapical scar with no ischemia. His most recent 2-D echo performed 09/28/14 revealed normal LV systolic function. He denies chest pain or shortness of breath. 

## 2016-07-06 NOTE — Assessment & Plan Note (Signed)
Controlled.  

## 2016-08-18 ENCOUNTER — Emergency Department (HOSPITAL_COMMUNITY)
Admission: EM | Admit: 2016-08-18 | Discharge: 2016-08-18 | Disposition: A | Discharge status: Home / Self Care | Payer: Federal, State, Local not specified - PPO | Attending: Emergency Medicine | Admitting: Emergency Medicine

## 2016-08-18 ENCOUNTER — Encounter (HOSPITAL_COMMUNITY): Payer: Self-pay | Admitting: Emergency Medicine

## 2016-08-18 DIAGNOSIS — Z23 Encounter for immunization: Secondary | ICD-10-CM | POA: Insufficient documentation

## 2016-08-18 DIAGNOSIS — W540XXA Bitten by dog, initial encounter: Secondary | ICD-10-CM | POA: Diagnosis not present

## 2016-08-18 DIAGNOSIS — S61452A Open bite of left hand, initial encounter: Secondary | ICD-10-CM | POA: Diagnosis not present

## 2016-08-18 DIAGNOSIS — S81851A Open bite, right lower leg, initial encounter: Secondary | ICD-10-CM | POA: Insufficient documentation

## 2016-08-18 DIAGNOSIS — Z7982 Long term (current) use of aspirin: Secondary | ICD-10-CM | POA: Insufficient documentation

## 2016-08-18 DIAGNOSIS — Y929 Unspecified place or not applicable: Secondary | ICD-10-CM | POA: Insufficient documentation

## 2016-08-18 DIAGNOSIS — Y990 Civilian activity done for income or pay: Secondary | ICD-10-CM | POA: Insufficient documentation

## 2016-08-18 DIAGNOSIS — Y9389 Activity, other specified: Secondary | ICD-10-CM | POA: Insufficient documentation

## 2016-08-18 DIAGNOSIS — Z79899 Other long term (current) drug therapy: Secondary | ICD-10-CM | POA: Diagnosis not present

## 2016-08-18 DIAGNOSIS — I2510 Atherosclerotic heart disease of native coronary artery without angina pectoris: Secondary | ICD-10-CM | POA: Diagnosis not present

## 2016-08-18 DIAGNOSIS — I10 Essential (primary) hypertension: Secondary | ICD-10-CM | POA: Diagnosis not present

## 2016-08-18 DIAGNOSIS — IMO0002 Reserved for concepts with insufficient information to code with codable children: Secondary | ICD-10-CM

## 2016-08-18 MED ORDER — HYDROCODONE-ACETAMINOPHEN 5-325 MG PO TABS: 1.0000 | ORAL_TABLET | Freq: Four times a day (QID) | ORAL | 0 refills | Status: DC | PRN

## 2016-08-18 MED ORDER — BACITRACIN ZINC 500 UNIT/GM EX OINT
TOPICAL_OINTMENT | CUTANEOUS | Status: AC
  Filled 2016-08-18: qty 0.9

## 2016-08-18 MED ORDER — BACITRACIN ZINC 500 UNIT/GM EX OINT
TOPICAL_OINTMENT | Freq: Every day | CUTANEOUS | Status: DC
  Administered 2016-08-18: 1 via TOPICAL

## 2016-08-18 MED ORDER — LIDOCAINE-EPINEPHRINE (PF) 2 %-1:200000 IJ SOLN
20.0000 mL | Freq: Once | INTRAMUSCULAR | Status: AC
  Administered 2016-08-18: 20 mL
  Filled 2016-08-18: qty 20

## 2016-08-18 MED ORDER — LIDOCAINE-EPINEPHRINE 2 %-1:100000 IJ SOLN: 20.0000 mL | Freq: Once | INTRAMUSCULAR | Status: DC

## 2016-08-18 MED ORDER — TETANUS-DIPHTH-ACELL PERTUSSIS 5-2.5-18.5 LF-MCG/0.5 IM SUSP
0.5000 mL | Freq: Once | INTRAMUSCULAR | Status: AC
  Administered 2016-08-18: 0.5 mL via INTRAMUSCULAR
  Filled 2016-08-18: qty 0.5

## 2016-08-18 MED ORDER — AMOXICILLIN-POT CLAVULANATE 875-125 MG PO TABS: 1.0000 | ORAL_TABLET | Freq: Two times a day (BID) | ORAL | 0 refills | Status: DC

## 2016-08-18 NOTE — ED Triage Notes (Signed)
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 Pt also c/o abrasion to right hand. Fentanyl with EMS.

## 2016-08-18 NOTE — ED Notes (Signed)
Bed: [id number] Expected date:  Expected time:  Means of arrival:  Comments: 55 yo animal bite, pain meds given

## 2016-08-18 NOTE — ED Provider Notes (Signed)
[facility]-EMERGENCY DEPT Provider Note   CSN: [id number] Arrival date & time: 08/18/16  1422     History   Chief Complaint Chief Complaint  Patient presents with  . Animal Bite    HPI Eric Ramos is a 55 y.o. male.  The history is provided by the patient.  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 Bradycardia    a. Sinus bradycardia in the 40's during 2010 admission.  . Coronary artery disease    a. STEMI 09/2009 s/p PTCA/DES to LAD, residual moderate RCA disease (50-60%). b. Low risk stress test 2011 - scar but no ischemia.  Marland Kitchen. Hx of echocardiogram    a. EF 50-55% at time of STEMI 2010. b. Echo 05/2013: EF 55-60%, normal wall motion, normal echo.  . Hyperlipemia   . Hypertension   . NSVT (nonsustained ventricular tachycardia) (HCC)    a. During 2010 admission for STEMI.   . SVT (supraventricular tachycardia) (HCC)    a. 7 beats PSVT by event monitor in 2014.  Marland Kitchen. Syncope    a. Prior episode while having skin lesion removed by dermatologist. b. Recurrence in [place] [employer] [place] [place]: event monitor ordered but patient only partially wore; BP soft so ACEI d/c'd.   . Unexplained weight loss     Patient Active Problem List   Diagnosis Date Noted  . NSVT  (nonsustained ventricular tachycardia) (HCC)   . Bradycardia   . SVT (supraventricular tachycardia) (HCC)   . Hyperlipidemia 06/24/2013  . Anxiety state 06/23/2013  . Chronic coronary artery disease 05/25/2013  . Syncope 05/25/2013  . Essential hypertension 05/25/2013  . RMSF [facility]([place] spotted fever) 06/28/2011    Past Surgical History:  Procedure Laterality Date  . CARDIAC CATHETERIZATION         Home Medications    Prior to Admission medications   Medication Sig Start Date End Date Taking? Authorizing Provider  aspirin 81 MG EC tablet Take 162 mg by mouth daily.     Historical Provider, MD  clopidogrel (PLAVIX) 75 MG tablet TAKE 1 TABLET BY MOUTH DAILY 03/06/16   Runell GessJonathan J Berry, MD  metoprolol tartrate (LOPRESSOR) 25 MG tablet TAKE 1 TABLET BY MOUTH DAILY 03/06/16   Runell GessJonathan J Berry, MD  rosuvastatin (CRESTOR) 40 MG tablet TAKE 1 TABLET BY MOUTH DAILY 03/06/16   Runell GessJonathan J Berry, MD    Family History Family History  Problem Relation Age of Onset  . Heart attack Mother   . Cancer Father     liver    Social History Social History  Substance Use Topics  . Smoking status: Never Smoker  . Smokeless tobacco: Never Used  . Alcohol use Yes  Comment: some alcohol     Allergies   Review of patient's allergies indicates no known allergies.   Review of Systems Review of Systems  Neurological: Negative for numbness.  All other systems reviewed and are negative.    Physical Exam Updated Vital Signs BP 113/75 (BP Location: Right Arm)   Pulse (!) 58   Temp 97.4 F (36.3 C) (Oral)   Resp 16   SpO2 95%   Physical Exam  Constitutional: He is oriented to person, place, and time. He appears well-developed and well-nourished. No distress.  HENT:  Head: Normocephalic and atraumatic.  Eyes: EOM are normal. Pupils are equal, round, and reactive to light.  Cardiovascular: Normal rate.   Pulmonary/Chest: Effort normal.  Musculoskeletal: He exhibits tenderness.        Hands:      Right lower leg: He exhibits laceration.       Legs: Neurological: He is alert and oriented to person, place, and time. He has normal strength. No sensory deficit.  Normal hand function with 5/5 grip strength and no sensory deficits.  Normal right foot flexion and extension.  No numbness in the distal right lower ext.  Skin: Skin is warm and dry.  Psychiatric: He has a normal mood and affect. His behavior is normal.  Nursing note and vitals reviewed.    ED Treatments / Results  Labs (all labs ordered are listed, but only abnormal results are displayed) Labs Reviewed - No data to display  EKG  EKG Interpretation None       Radiology No results found.  Procedures Procedures (including critical care time)  Medications Ordered in ED Medications  Tdap (BOOSTRIX) injection 0.5 mL (not administered)  lidocaine-EPINEPHrine (XYLOCAINE W/EPI) 2 %-1:200000 (PF) injection 20 mL (20 mLs Infiltration Given by Other 08/18/16 1449)     Initial Impression / Assessment and Plan / ED Course  I have reviewed the triage vital signs and the nursing notes.  Pertinent labs & imaging results that were available during my care of the patient were reviewed by me and considered in my medical decision making (see chart for details).  Clinical Course   Pt presenting after dog bite.  [place] UTD.  Law enforcement present on the scene.  Injuries as above.  Low gapping portion of  Wound repaired.  Pt given tetanus here and sent home with augmentin.  LACERATION REPAIR Performed by: Gwyneth Sprout Authorized byGwyneth Sprout Consent: Verbal consent obtained. Risks and benefits: risks, benefits and alternatives were discussed Consent given by: patient Patient identity confirmed: provided demographic data Prepped and Draped in normal sterile fashion Wound explored  Laceration Location: right calf  Laceration Length: 4cm  No Foreign Bodies seen or palpated  Anesthesia:  local infiltration  Local anesthetic: lidocaine 1% with epinephrine  Anesthetic total: 5 ml  Irrigation method: syringe Amount of cleaning: standard  Skin closure: staples  Number of sutures: 5  Technique: staples  Patient tolerance: Patient tolerated the procedure well with no immediate complications.   Final Clinical Impressions(s) / ED Diagnoses   Final diagnoses:  Dog bite  Laceration    New Prescriptions New Prescriptions   AMOXICILLIN-CLAVULANATE (AUGMENTIN) 875-125 MG TABLET    Take 1 tablet by mouth every 12 (twelve) hours.   HYDROCODONE-ACETAMINOPHEN (NORCO/VICODIN) 5-325 MG TABLET    Take 1-2 tablets by mouth every 6 (six) hours as needed for severe pain.     Gwyneth Sprout, MD 08/18/16 1513

## 2016-09-06 ENCOUNTER — Ambulatory Visit (HOSPITAL_COMMUNITY)
Admission: EM | Admit: 2016-09-06 | Discharge: 2016-09-06 | Disposition: A | Discharge status: Home / Self Care | Payer: Federal, State, Local not specified - PPO | Attending: Emergency Medicine | Admitting: Emergency Medicine

## 2016-09-06 ENCOUNTER — Encounter (HOSPITAL_COMMUNITY): Payer: Self-pay | Admitting: Family Medicine

## 2016-09-06 DIAGNOSIS — T148 Other injury of unspecified body region: Secondary | ICD-10-CM

## 2016-09-06 DIAGNOSIS — L089 Local infection of the skin and subcutaneous tissue, unspecified: Secondary | ICD-10-CM

## 2016-09-06 DIAGNOSIS — Z4802 Encounter for removal of sutures: Secondary | ICD-10-CM

## 2016-09-06 DIAGNOSIS — T148XXA Other injury of unspecified body region, initial encounter: Secondary | ICD-10-CM

## 2016-09-06 MED ORDER — CEPHALEXIN 500 MG PO CAPS: 500.0000 mg | ORAL_CAPSULE | Freq: Four times a day (QID) | ORAL | 0 refills | Status: DC

## 2016-09-06 NOTE — Discharge Instructions (Signed)
Read instructions on your papers for wound care. Keep the wound clean with soap and water daily. Keep covered as long as there is any wetness or drainage. Take your antibiotics as directed. If the area of redness increases or he develop more pain, swelling or white pus draining from the wound seek medical attention promptly.

## 2016-09-06 NOTE — ED Triage Notes (Signed)
Pt here to have staples removed from RLE

## 2016-09-06 NOTE — ED Provider Notes (Signed)
CSN: [id number]     Arrival date & time 09/06/16  1543 History   None    Chief Complaint  Patient presents with  . Suture / Staple Removal   (Consider location/radiation/quality/duration/timing/severity/associated sxs/prior Treatment) 55 year old male here for staple removal. States he received a dog bite was right leg on August 24. [facility] a portion of the wound and placed him on amoxicillin, history per patient. He presents for half weeks later to the urgent care for staple removal. Complaining of tenderness surrounding the wound.      Past Medical History:  Diagnosis Date  . Bradycardia    a. Sinus bradycardia in the 40's during 2010 admission.  . Coronary artery disease    a. STEMI 09/2009 s/p PTCA/DES to LAD, residual moderate RCA disease (50-60%). b. Low risk stress test 2011 - scar but no ischemia.  Marland Kitchen. Hx of echocardiogram    a. EF 50-55% at time of STEMI 2010. b. Echo 05/2013: EF 55-60%, normal wall motion, normal echo.  . Hyperlipemia   . Hypertension   . NSVT (nonsustained ventricular tachycardia) (HCC)    a. During 2010 admission for STEMI.   . SVT (supraventricular tachycardia) (HCC)    a. 7 beats PSVT by event monitor in 2014.  Marland Kitchen. Syncope    a. Prior episode while having skin lesion removed by dermatologist. b. Recurrence in [place] [employer] [place] monitor ordered but patient only partially wore; BP soft so ACEI d/c'd.   . Unexplained weight loss    Past Surgical History:  Procedure Laterality Date  . CARDIAC CATHETERIZATION     Family History  Problem Relation Age of Onset  . Heart attack Mother   . Cancer Father     liver   Social History  Substance Use Topics  . Smoking status: Never Smoker  . Smokeless tobacco: Never Used  . Alcohol use Yes     Comment: some alcohol    Review of Systems  Constitutional: Negative.  Negative for fever.  Skin: Positive for color change.  Neurological:  Negative.   All other systems reviewed and are negative.   Allergies  Review of patient's allergies indicates no known allergies.  Home Medications   Prior to Admission medications   Medication Sig Start Date End Date Taking? Authorizing Provider  aspirin 81 MG EC tablet Take 162 mg by mouth daily.     Historical Provider, MD  cephALEXin (KEFLEX) 500 MG capsule Take 1 capsule (500 mg total) by mouth 4 (four) times daily. 09/06/16   Hayden Rasmussenavid Dhyan Noah, NP  clopidogrel (PLAVIX) 75 MG tablet TAKE 1 TABLET BY MOUTH DAILY 03/06/16   Runell GessJonathan J Berry, MD  HYDROcodone-acetaminophen (NORCO/VICODIN) 5-325 MG tablet Take 1-2 tablets by mouth every 6 (six) hours as needed for severe pain. 08/18/16   Gwyneth SproutWhitney Plunkett, MD  metoprolol tartrate (LOPRESSOR) 25 MG tablet TAKE 1 TABLET BY MOUTH DAILY 03/06/16   Runell GessJonathan J Berry, MD  rosuvastatin (CRESTOR) 40 MG tablet TAKE 1 TABLET BY MOUTH DAILY 03/06/16   Runell GessJonathan J Berry, MD   Meds Ordered and Administered this Visit  Medications - No data to display  BP 110/56 (BP Location: Left Arm)   Pulse 60   Temp 98.8 F (37.1 C) (Oral)   Resp 12   SpO2 98%  No data found.   Physical Exam  Constitutional: He is oriented to person, place, and time. He appears well-developed and well-nourished. No distress.  Neck:  Neck supple.  Pulmonary/Chest: Effort normal.  Musculoskeletal: Normal range of motion. He exhibits no edema.  Neurological: He is alert and oriented to person, place, and time.  Skin: Skin is warm and dry.  The wound only right lower leg had remained partially open and is healing by secondary intention. There are 4 staples remaining. Surrounding the wound by approximately 1/2 cm is erythema and tenderness. Manual pressure to the edges of the wound produces some blood and serosanguineous fluid. There is some darker colored exudate as well. This may be purulence.  Psychiatric: He has a normal mood and affect.  Nursing note and vitals reviewed.   Urgent Care  Course   Clinical Course    Procedures (including critical care time)  Labs Review Labs Reviewed - No data to display  Imaging Review No results found.   Visual Acuity Review  Right Eye Distance:   Left Eye Distance:   Bilateral Distance:    Right Eye Near:   Left Eye Near:    Bilateral Near:         MDM   1. Encounter for staple removal   2. Wound infection (HCC)    Read instructions on your papers for wound care. Keep the wound clean with soap and water daily. Keep covered as long as there is any wetness or drainage. Take your antibiotics as directed. If the area of redness increases or he develop more pain, swelling or white pus draining from the wound seek medical attention promptly. Meds ordered this encounter  Medications  . cephALEXin (KEFLEX) 500 MG capsule    Sig: Take 1 capsule (500 mg total) by mouth 4 (four) times daily.    Dispense:  28 capsule    Refill:  0    Order Specific Question:   Supervising Provider    Answer:   Domenick Gong [[id number]]  All 4 staples removed. Wound dressing and betadine cleanse.    Hayden Rasmussen, NP 09/06/16 1739    Hayden Rasmussen, NP 09/06/16 1741    Hayden Rasmussen, NP 09/06/16 1742

## 2019-03-28 ENCOUNTER — Inpatient Hospital Stay (HOSPITAL_COMMUNITY)
Admission: EM | Admit: 2019-03-28 | Discharge: 2019-03-30 | DRG: 247 | Disposition: A | Discharge status: Home / Self Care | Payer: Federal, State, Local not specified - PPO | Attending: Internal Medicine | Admitting: Internal Medicine

## 2019-03-28 ENCOUNTER — Encounter (HOSPITAL_COMMUNITY): Payer: Self-pay | Admitting: Emergency Medicine

## 2019-03-28 ENCOUNTER — Emergency Department (HOSPITAL_COMMUNITY): Payer: Federal, State, Local not specified - PPO

## 2019-03-28 ENCOUNTER — Emergency Department (HOSPITAL_COMMUNITY)
Admission: EM | Admit: 2019-03-28 | Discharge: 2019-03-28 | Disposition: A | Discharge status: Home / Self Care | Payer: Federal, State, Local not specified - PPO | Source: Home / Self Care | Attending: Emergency Medicine | Admitting: Emergency Medicine

## 2019-03-28 ENCOUNTER — Other Ambulatory Visit: Payer: Self-pay

## 2019-03-28 ENCOUNTER — Encounter (HOSPITAL_COMMUNITY)
Admission: EM | Disposition: A | Discharge status: Home / Self Care | Payer: Self-pay | Source: Home / Self Care | Attending: Internal Medicine

## 2019-03-28 ENCOUNTER — Encounter (HOSPITAL_COMMUNITY): Payer: Self-pay

## 2019-03-28 DIAGNOSIS — I2510 Atherosclerotic heart disease of native coronary artery without angina pectoris: Secondary | ICD-10-CM

## 2019-03-28 DIAGNOSIS — Z955 Presence of coronary angioplasty implant and graft: Secondary | ICD-10-CM | POA: Diagnosis not present

## 2019-03-28 DIAGNOSIS — Z8249 Family history of ischemic heart disease and other diseases of the circulatory system: Secondary | ICD-10-CM | POA: Diagnosis not present

## 2019-03-28 DIAGNOSIS — Z7982 Long term (current) use of aspirin: Secondary | ICD-10-CM

## 2019-03-28 DIAGNOSIS — I252 Old myocardial infarction: Secondary | ICD-10-CM

## 2019-03-28 DIAGNOSIS — R001 Bradycardia, unspecified: Secondary | ICD-10-CM | POA: Diagnosis present

## 2019-03-28 DIAGNOSIS — Z79899 Other long term (current) drug therapy: Secondary | ICD-10-CM

## 2019-03-28 DIAGNOSIS — I959 Hypotension, unspecified: Secondary | ICD-10-CM | POA: Diagnosis present

## 2019-03-28 DIAGNOSIS — I471 Supraventricular tachycardia: Secondary | ICD-10-CM | POA: Diagnosis present

## 2019-03-28 DIAGNOSIS — I361 Nonrheumatic tricuspid (valve) insufficiency: Secondary | ICD-10-CM | POA: Diagnosis not present

## 2019-03-28 DIAGNOSIS — E785 Hyperlipidemia, unspecified: Secondary | ICD-10-CM | POA: Diagnosis present

## 2019-03-28 DIAGNOSIS — J189 Pneumonia, unspecified organism: Secondary | ICD-10-CM | POA: Insufficient documentation

## 2019-03-28 DIAGNOSIS — I2102 ST elevation (STEMI) myocardial infarction involving left anterior descending coronary artery: Secondary | ICD-10-CM | POA: Diagnosis present

## 2019-03-28 DIAGNOSIS — Z7902 Long term (current) use of antithrombotics/antiplatelets: Secondary | ICD-10-CM | POA: Diagnosis not present

## 2019-03-28 DIAGNOSIS — I10 Essential (primary) hypertension: Secondary | ICD-10-CM | POA: Insufficient documentation

## 2019-03-28 DIAGNOSIS — Z808 Family history of malignant neoplasm of other organs or systems: Secondary | ICD-10-CM | POA: Diagnosis not present

## 2019-03-28 DIAGNOSIS — R079 Chest pain, unspecified: Secondary | ICD-10-CM | POA: Diagnosis present

## 2019-03-28 DIAGNOSIS — I213 ST elevation (STEMI) myocardial infarction of unspecified site: Secondary | ICD-10-CM

## 2019-03-28 HISTORY — PX: LEFT HEART CATH AND CORONARY ANGIOGRAPHY: CATH118249

## 2019-03-28 HISTORY — PX: CORONARY/GRAFT ACUTE MI REVASCULARIZATION: CATH118305

## 2019-03-28 LAB — CBC WITH DIFFERENTIAL/PLATELET
Abs Immature Granulocytes: 0.05 10*3/uL (ref 0.00–0.07)
Basophils Absolute: 0 10*3/uL (ref 0.0–0.1)
Basophils Relative: 0 %
Eosinophils Absolute: 0.1 10*3/uL (ref 0.0–0.5)
Eosinophils Relative: 1 %
HCT: 42.9 % (ref 39.0–52.0)
Hemoglobin: 13.7 g/dL (ref 13.0–17.0)
Immature Granulocytes: 1 %
Lymphocytes Relative: 9 %
Lymphs Abs: 0.9 10*3/uL (ref 0.7–4.0)
MCH: 28.9 pg (ref 26.0–34.0)
MCHC: 31.9 g/dL (ref 30.0–36.0)
MCV: 90.5 fL (ref 80.0–100.0)
Monocytes Absolute: 0.3 10*3/uL (ref 0.1–1.0)
Monocytes Relative: 3 %
Neutro Abs: 8.8 10*3/uL — ABNORMAL HIGH (ref 1.7–7.7)
Neutrophils Relative %: 86 %
Platelets: 214 10*3/uL (ref 150–400)
RBC: 4.74 MIL/uL (ref 4.22–5.81)
RDW: 12.7 % (ref 11.5–15.5)
WBC: 10.2 10*3/uL (ref 4.0–10.5)
nRBC: 0 % (ref 0.0–0.2)

## 2019-03-28 LAB — CBC
HCT: 44.9 % (ref 39.0–52.0)
Hemoglobin: 14.2 g/dL (ref 13.0–17.0)
MCH: 28.9 pg (ref 26.0–34.0)
MCHC: 31.6 g/dL (ref 30.0–36.0)
MCV: 91.4 fL (ref 80.0–100.0)
Platelets: 194 10*3/uL (ref 150–400)
RBC: 4.91 MIL/uL (ref 4.22–5.81)
RDW: 12.5 % (ref 11.5–15.5)
WBC: 6.9 10*3/uL (ref 4.0–10.5)
nRBC: 0 % (ref 0.0–0.2)

## 2019-03-28 LAB — COMPREHENSIVE METABOLIC PANEL
ALT: 16 U/L (ref 0–44)
AST: 44 U/L — ABNORMAL HIGH (ref 15–41)
Albumin: 3.5 g/dL (ref 3.5–5.0)
Alkaline Phosphatase: 69 U/L (ref 38–126)
Anion gap: 10 (ref 5–15)
BUN: 18 mg/dL (ref 6–20)
CO2: 24 mmol/L (ref 22–32)
Calcium: 8.8 mg/dL — ABNORMAL LOW (ref 8.9–10.3)
Chloride: 102 mmol/L (ref 98–111)
Creatinine, Ser: 0.98 mg/dL (ref 0.61–1.24)
GFR calc Af Amer: >60 mL/min (ref >60)
GFR calc non Af Amer: >60 mL/min (ref >60)
Glucose, Bld: 114 mg/dL — ABNORMAL HIGH (ref 70–99)
Potassium: 4.1 mmol/L (ref 3.5–5.1)
Sodium: 136 mmol/L (ref 135–145)
Total Bilirubin: 1 mg/dL (ref 0.3–1.2)
Total Protein: 6.4 g/dL — ABNORMAL LOW (ref 6.5–8.1)

## 2019-03-28 LAB — BASIC METABOLIC PANEL
Anion gap: 10 (ref 5–15)
BUN: 19 mg/dL (ref 6–20)
CO2: 25 mmol/L (ref 22–32)
Calcium: 9.1 mg/dL (ref 8.9–10.3)
Chloride: 103 mmol/L (ref 98–111)
Creatinine, Ser: 0.94 mg/dL (ref 0.61–1.24)
GFR calc Af Amer: >60 mL/min (ref >60)
GFR calc non Af Amer: >60 mL/min (ref >60)
Glucose, Bld: 102 mg/dL — ABNORMAL HIGH (ref 70–99)
Potassium: 4.3 mmol/L (ref 3.5–5.1)
Sodium: 138 mmol/L (ref 135–145)

## 2019-03-28 LAB — APTT: aPTT: >200 seconds (ref 24–36)

## 2019-03-28 LAB — TROPONIN I
Troponin I: <0.03 ng/mL (ref <0.03)
Troponin I: <0.03 ng/mL (ref <0.03)
Troponin I: 3.72 ng/mL (ref <0.03)

## 2019-03-28 LAB — PROTIME-INR
INR: 1.2 (ref 0.8–1.2)
Prothrombin Time: 15.5 seconds — ABNORMAL HIGH (ref 11.4–15.2)

## 2019-03-28 LAB — TSH: TSH: 4.064 u[IU]/mL (ref 0.350–4.500)

## 2019-03-28 SURGERY — CORONARY/GRAFT ACUTE MI REVASCULARIZATION
Anesthesia: LOCAL

## 2019-03-28 MED ORDER — DOXYCYCLINE HYCLATE 100 MG PO CAPS: 100.0000 mg | ORAL_CAPSULE | Freq: Two times a day (BID) | ORAL | 0 refills | Status: DC

## 2019-03-28 MED ORDER — ONDANSETRON HCL 4 MG/2ML IJ SOLN: 4.0000 mg | Freq: Four times a day (QID) | INTRAMUSCULAR | Status: DC | PRN

## 2019-03-28 MED ORDER — HEPARIN SODIUM (PORCINE) 1000 UNIT/ML IJ SOLN
INTRAMUSCULAR | Status: AC
  Filled 2019-03-28: qty 1

## 2019-03-28 MED ORDER — FENTANYL CITRATE (PF) 100 MCG/2ML IJ SOLN
INTRAMUSCULAR | Status: AC
  Administered 2019-03-28: 19:00:00
  Filled 2019-03-28: qty 2

## 2019-03-28 MED ORDER — HEPARIN (PORCINE) IN NACL 1000-0.9 UT/500ML-% IV SOLN
INTRAVENOUS | Status: AC
  Filled 2019-03-28: qty 1000

## 2019-03-28 MED ORDER — MIDAZOLAM HCL 2 MG/2ML IJ SOLN
INTRAMUSCULAR | Status: AC
  Filled 2019-03-28: qty 2

## 2019-03-28 MED ORDER — IOHEXOL 350 MG/ML SOLN
INTRAVENOUS | Status: DC | PRN
  Administered 2019-03-28: 205 mL via INTRAVENOUS

## 2019-03-28 MED ORDER — MORPHINE SULFATE (PF) 4 MG/ML IV SOLN
INTRAVENOUS | Status: AC
  Filled 2019-03-28: qty 1

## 2019-03-28 MED ORDER — LIDOCAINE HCL (PF) 1 % IJ SOLN
INTRAMUSCULAR | Status: AC
  Filled 2019-03-28: qty 30

## 2019-03-28 MED ORDER — ACETAMINOPHEN 325 MG PO TABS: 650.0000 mg | ORAL_TABLET | ORAL | Status: DC | PRN

## 2019-03-28 MED ORDER — TIROFIBAN (AGGRASTAT) BOLUS VIA INFUSION
INTRAVENOUS | Status: DC | PRN
  Administered 2019-03-28: 1917.5 ug via INTRAVENOUS

## 2019-03-28 MED ORDER — PRASUGREL HCL 10 MG PO TABS
ORAL_TABLET | ORAL | Status: AC
  Filled 2019-03-28: qty 1

## 2019-03-28 MED ORDER — SODIUM CHLORIDE 0.9 % IV SOLN: 250.0000 mL | INTRAVENOUS | Status: DC | PRN

## 2019-03-28 MED ORDER — PRASUGREL HCL 10 MG PO TABS
ORAL_TABLET | ORAL | Status: DC | PRN
  Administered 2019-03-28: 60 mg via ORAL

## 2019-03-28 MED ORDER — ASPIRIN 81 MG PO CHEW: 324.0000 mg | CHEWABLE_TABLET | ORAL | Status: DC

## 2019-03-28 MED ORDER — HEPARIN SODIUM (PORCINE) 5000 UNIT/ML IJ SOLN
4000.0000 [IU] | Freq: Once | INTRAMUSCULAR | Status: AC
  Administered 2019-03-28: 4000 [IU] via INTRAVENOUS

## 2019-03-28 MED ORDER — SODIUM CHLORIDE 0.9% FLUSH
3.0000 mL | Freq: Two times a day (BID) | INTRAVENOUS | Status: DC
  Administered 2019-03-29 – 2019-03-30 (×2): 3 mL via INTRAVENOUS

## 2019-03-28 MED ORDER — FENTANYL CITRATE (PF) 100 MCG/2ML IJ SOLN
INTRAMUSCULAR | Status: AC
  Filled 2019-03-28: qty 2

## 2019-03-28 MED ORDER — FENTANYL CITRATE (PF) 100 MCG/2ML IJ SOLN
INTRAMUSCULAR | Status: DC | PRN
  Administered 2019-03-28: 25 ug via INTRAVENOUS

## 2019-03-28 MED ORDER — VERAPAMIL HCL 2.5 MG/ML IV SOLN
INTRAVENOUS | Status: DC | PRN
  Administered 2019-03-28: 10 mL via INTRA_ARTERIAL

## 2019-03-28 MED ORDER — ENOXAPARIN SODIUM 40 MG/0.4ML ~~LOC~~ SOLN
40.0000 mg | SUBCUTANEOUS | Status: DC
  Administered 2019-03-29 – 2019-03-30 (×2): 40 mg via SUBCUTANEOUS
  Filled 2019-03-28 (×2): qty 0.4

## 2019-03-28 MED ORDER — LIDOCAINE HCL (PF) 1 % IJ SOLN
INTRAMUSCULAR | Status: DC | PRN
  Administered 2019-03-28: 2 mL via SUBCUTANEOUS

## 2019-03-28 MED ORDER — ASPIRIN EC 81 MG PO TBEC
81.0000 mg | DELAYED_RELEASE_TABLET | Freq: Every day | ORAL | Status: DC
  Administered 2019-03-29 – 2019-03-30 (×2): 81 mg via ORAL
  Filled 2019-03-28 (×2): qty 1

## 2019-03-28 MED ORDER — NITROGLYCERIN 1 MG/10 ML FOR IR/CATH LAB
INTRA_ARTERIAL | Status: DC | PRN
  Administered 2019-03-28: 200 ug via INTRACORONARY

## 2019-03-28 MED ORDER — MORPHINE SULFATE (PF) 4 MG/ML IV SOLN
4.0000 mg | Freq: Once | INTRAVENOUS | Status: AC
  Administered 2019-03-28: 19:00:00 4 mg via INTRAVENOUS

## 2019-03-28 MED ORDER — HYDRALAZINE HCL 20 MG/ML IJ SOLN: 10.0000 mg | INTRAMUSCULAR | Status: AC | PRN

## 2019-03-28 MED ORDER — LABETALOL HCL 5 MG/ML IV SOLN: 10.0000 mg | INTRAVENOUS | Status: AC | PRN

## 2019-03-28 MED ORDER — ASPIRIN 300 MG RE SUPP: 300.0000 mg | RECTAL | Status: DC

## 2019-03-28 MED ORDER — SODIUM CHLORIDE 0.9% FLUSH
3.0000 mL | INTRAVENOUS | Status: DC | PRN
  Administered 2019-03-29: 3 mL via INTRAVENOUS
  Filled 2019-03-28: qty 3

## 2019-03-28 MED ORDER — METOPROLOL TARTRATE 25 MG PO TABS: 25.0000 mg | ORAL_TABLET | Freq: Every day | ORAL | Status: DC

## 2019-03-28 MED ORDER — NITROGLYCERIN 1 MG/10 ML FOR IR/CATH LAB
INTRA_ARTERIAL | Status: AC
  Filled 2019-03-28: qty 10

## 2019-03-28 MED ORDER — MIDAZOLAM HCL 2 MG/2ML IJ SOLN
INTRAMUSCULAR | Status: DC | PRN
  Administered 2019-03-28: 0.5 mg via INTRAVENOUS

## 2019-03-28 MED ORDER — PRASUGREL HCL 10 MG PO TABS
10.0000 mg | ORAL_TABLET | Freq: Every day | ORAL | Status: DC
  Administered 2019-03-29 – 2019-03-30 (×2): 10 mg via ORAL
  Filled 2019-03-28 (×2): qty 1

## 2019-03-28 MED ORDER — TIROFIBAN HCL IN NACL 5-0.9 MG/100ML-% IV SOLN
INTRAVENOUS | Status: DC | PRN
  Administered 2019-03-28: 0.15 ug/kg/min via INTRAVENOUS

## 2019-03-28 MED ORDER — NITROGLYCERIN 0.4 MG SL SUBL: 0.4000 mg | SUBLINGUAL_TABLET | SUBLINGUAL | Status: DC | PRN

## 2019-03-28 MED ORDER — SODIUM CHLORIDE 0.9 % IV SOLN
INTRAVENOUS | Status: AC
  Administered 2019-03-28: 22:00:00 via INTRAVENOUS

## 2019-03-28 MED ORDER — HEPARIN SODIUM (PORCINE) 1000 UNIT/ML IJ SOLN
INTRAMUSCULAR | Status: DC | PRN
  Administered 2019-03-28: 3000 [IU] via INTRAVENOUS
  Administered 2019-03-28 (×2): 4000 [IU] via INTRAVENOUS

## 2019-03-28 MED ORDER — ROSUVASTATIN CALCIUM 20 MG PO TABS
40.0000 mg | ORAL_TABLET | Freq: Every day | ORAL | Status: DC
  Administered 2019-03-28 – 2019-03-30 (×3): 40 mg via ORAL
  Filled 2019-03-28 (×3): qty 2

## 2019-03-28 MED ORDER — SODIUM CHLORIDE 0.9 % IV SOLN
INTRAVENOUS | Status: DC
  Administered 2019-03-28: 999 mL via INTRAVENOUS

## 2019-03-28 MED ORDER — HEPARIN (PORCINE) IN NACL 1000-0.9 UT/500ML-% IV SOLN
INTRAVENOUS | Status: DC | PRN
  Administered 2019-03-28 (×2): 500 mL

## 2019-03-28 MED ORDER — TIROFIBAN HCL IN NACL 5-0.9 MG/100ML-% IV SOLN
INTRAVENOUS | Status: AC
  Filled 2019-03-28: qty 100

## 2019-03-28 SURGICAL SUPPLY — 21 items
BALLN SAPPHIRE 2.5X12 (BALLOONS) ×2
BALLN SAPPHIRE ~~LOC~~ 2.5X12 (BALLOONS) IMPLANT
BALLN SAPPHIRE ~~LOC~~ 3.0X15 (BALLOONS) ×2 IMPLANT
BALLN SAPPHIRE ~~LOC~~ 3.25X12 (BALLOONS) ×2 IMPLANT
BALLOON SAPPHIRE 2.5X12 (BALLOONS) ×1 IMPLANT
CATH INFINITI JR4 5F (CATHETERS) ×2 IMPLANT
CATH LAUNCHER 6FR EBU3.5 (CATHETERS) ×2 IMPLANT
DEVICE RAD COMP TR BAND LRG (VASCULAR PRODUCTS) ×2 IMPLANT
GLIDESHEATH SLEND SS 6F .021 (SHEATH) ×2 IMPLANT
GUIDEWIRE INQWIRE 1.5J.035X260 (WIRE) ×1 IMPLANT
INQWIRE 1.5J .035X260CM (WIRE) ×2
KIT ENCORE 26 ADVANTAGE (KITS) ×2 IMPLANT
KIT HEART LEFT (KITS) ×2 IMPLANT
PACK CARDIAC CATHETERIZATION (CUSTOM PROCEDURE TRAY) ×2 IMPLANT
STENT RESOLUTE ONYX 3.0X12 (Permanent Stent) ×2 IMPLANT
STENT RESOLUTE ONYX 3.0X26 (Permanent Stent) ×2 IMPLANT
TRANSDUCER W/STOPCOCK (MISCELLANEOUS) ×2 IMPLANT
TUBING CIL FLEX 10 FLL-RA (TUBING) ×2 IMPLANT
WIRE COUGAR XT STRL 190CM (WIRE) ×2 IMPLANT
WIRE PT2 MS 185 (WIRE) ×2 IMPLANT
WIRE RUNTHROUGH .014X180CM (WIRE) ×2 IMPLANT

## 2019-03-28 NOTE — H&P (Signed)
History and Physical   Patient ID: Eric Ramos, MRN: [id number], DOB: 20-May-1961   Date of Encounter: 03/28/2019, 7:19 PM  Primary Care Provider: System, Provider Not In Cardiologist: Allyson SabalBerry  Electrophysiologist:  NA  Chief Complaint:  CP/STEMI  History of Present Illness: Eric Ramos is a 58 y.o. male w/ h/o CAD, s/p PCI in 2010 to LAD subsequent to STEMI, with residual moderate dz in the RCA at that time (50-60%) who presents to ED w/ CP and acute EKG changes. Pt says he has been having intermittent chest discomfort for the past week or so. The pain comes on with exertion and is midsternal pressure or dull pain; it usually lasts for about 10 minutes or so and then goes away with rest. There is occasional associated SOB. Today the pain was longer in duration and more severe, so he came to ED for evaluation. EKG showed ST elevation in I, aVL, and V2 w/ reciprocal ST depression and TWI in the inferior leads. Initial trop negative. Repeat EKG looks better, but pt acutely became bradycardic into the 30s-40s while in the ED w/ SBP in the 80s. The bradycardia subsequently resolved shortly thereafter w/o intervention. Currently he is having CP 4-5/10.   Past Medical History:  Diagnosis Date   Bradycardia    a. Sinus bradycardia in the 40's during 2010 admission.   Coronary artery disease    a. STEMI 09/2009 s/p PTCA/DES to LAD, residual moderate RCA disease (50-60%). b. Low risk stress test 2011 - scar but no ischemia.   Hx of echocardiogram    a. EF 50-55% at time of STEMI 2010. b. Echo 05/2013: EF 55-60%, normal wall motion, normal echo.   Hyperlipemia    Hypertension    NSVT (nonsustained ventricular tachycardia) (HCC)    a. During 2010 admission for STEMI.    SVT (supraventricular tachycardia) (HCC)    a. 7 beats PSVT by event monitor in 2014.   Syncope    a. Prior episode while having skin lesion removed by dermatologist. b. Recur[place] in [place]  [employer] [place]: event monitor ordered but patient only partially wore; BP soft so ACEI d/c'd.    Unexplained weight loss     Past Surgical History:  Procedure Laterality Date   CARDIAC CATHETERIZATION       Prior to Admission medications   Medication Sig Start Date End Date Taking? Authorizing Provider  aspirin 81 MG EC tablet Take 162 mg by mouth daily.     [provider]  cephALEXin (KEFLEX) 500 MG capsule Take 1 capsule (500 mg total) by mouth 4 (four) times daily. 09/06/16   Hayden RasmussenMabe, David, NP  clopidogrel (PLAVIX) 75 MG tablet TAKE 1 TABLET BY MOUTH DAILY 03/06/16   Runell GessBerry, Jonathan J, MD  doxycycline (VIBRAMYCIN) 100 MG capsule Take 1 capsule (100 mg total) by mouth 2 (two) times daily. 03/28/19   Benjiman CorePickering, Nathan, MD  HYDROcodone-acetaminophen (NORCO/VICODIN) 5-325 MG tablet Take 1-2 tablets by mouth every 6 (six) hours as needed for severe pain. 08/18/16   Gwyneth SproutPlunkett, Whitney, MD  metoprolol tartrate (LOPRESSOR) 25 MG tablet TAKE 1 TABLET BY MOUTH DAILY 03/06/16   Runell GessBerry, Jonathan J, MD  rosuvastatin (CRESTOR) 40 MG tablet TAKE 1 TABLET BY MOUTH DAILY 03/06/16   Runell GessBerry, Jonathan J, MD     Allergies: No Known Allergies  Social History:  The patient  reports that he has never smoked. He has never used smokeless tobacco. He reports current alcohol use. He reports  that he does not use drugs.   Family History:  The patient's family history includes Cancer in his father; Heart attack in his mother.   ROS:  Please see the history of present illness.     All other systems reviewed and negative.   Vital Signs: Blood pressure (!) 165/89, pulse (!) 56, temperature 97.6 F (36.4 C), temperature source Oral, resp. rate (!) 22, SpO2 99 %.  PHYSICAL EXAM: General:  Well nourished, well developed, in mild distress HEENT: normal Lymph: no adenopathy Neck: no JVD Endocrine:  No thryomegaly Vascular: No carotid bruits; DP pulses 2+ bilaterally   Cardiac:  normal S1, S2; RRR; no murmur    Lungs:  clear to auscultation bilaterally, no wheezing, rhonchi or rales  Abd: soft, nontender, no hepatomegaly  Ext: no edema Musculoskeletal:  No deformities, BUE and BLE strength normal and equal Skin: warm and dry  Neuro:  CNs 2-12 intact, no focal abnormalities noted Psych:  Normal affect   EKG:  NSR with 1.5-73mm ST elevation in I, aVL, V2 with reciprocal STD and TWI in the inferior leads  Labs:   Lab Results  Component Value Date   WBC 6.9 03/28/2019   HGB 14.2 03/28/2019   HCT 44.9 03/28/2019   MCV 91.4 03/28/2019   PLT 194 03/28/2019   Recent Labs  Lab 03/28/19 1153  NA 138  K 4.3  CL 103  CO2 25  BUN 19  CREATININE 0.94  CALCIUM 9.1  GLUCOSE 102*   Recent Labs    03/28/19 1153 03/28/19 1412  TROPONINI <0.03 <0.03   Troponin (Point of Care Test) No results for input(s): TROPIPOC in the last 72 hours.  Lab Results  Component Value Date   CHOL (H) 09/17/2009    223        ATP III CLASSIFICATION:  <200     mg/dL   Desirable  161-096  mg/dL   Borderline High  >=045    mg/dL   High          HDL 39 (L) 09/17/2009   LDLCALC (H) 09/17/2009    163        Total Cholesterol/HDL:CHD Risk Coronary Heart Disease Risk Table                     Men   Women  1/2 Average Risk   3.4   3.3  Average Risk       5.0   4.4  2 X Average Risk   9.6   7.1  3 X Average Risk  23.4   11.0        Use the calculated Patient Ratio above and the CHD Risk Table to determine the patient's CHD Risk.        ATP III CLASSIFICATION (LDL):  <100     mg/dL   Optimal  409-811  mg/dL   Near or Above                    Optimal  130-159  mg/dL   Borderline  914-782  mg/dL   High  >956     mg/dL   Very High   TRIG 213 09/17/2009   No results found for: DDIMER  Radiology/Studies:  Dg Chest 2 View  Result Date: 03/28/2019 CLINICAL DATA:  Chest pain. EXAM: CHEST - 2 VIEW COMPARISON:  May 24, 2013 FINDINGS: Mild opacity in left base. A nipple shadow is again seen on the left.  The  heart, hila, mediastinum are normal. The right lung is clear. No pneumothorax. No other acute abnormalities. IMPRESSION: 1. Mild opacity in the lingula may represent subtle pneumonia/infiltrate. Recommend follow-up to resolution. Electronically Signed   By: Gerome Sam III M.D   On: 03/28/2019 12:46   Dg Chest Port 1 View  Result Date: 03/28/2019 CLINICAL DATA:  Chest pain that began 35 minutes ago EXAM: PORTABLE CHEST 1 VIEW COMPARISON:  03/28/2019 FINDINGS: The heart size and mediastinal contours are within normal limits. Both lungs are clear. The visualized skeletal structures are unremarkable. IMPRESSION: No active disease. Electronically Signed   By: Elige Ko   On: 03/28/2019 19:10   09-16-09 LHC: s/p PCI to pLAD w/ DES; residual moderate dz in RCA   TTE 09-28-14 Left ventricle: The cavity size was normal. Systolic function was normal. The estimated ejection fraction was in the range of 55% to 60%. Wall motion was normal; there were no regional wall motion abnormalities. - Mitral valve: There was mild regurgitation. - Left atrium: The atrium was mildly dilated. - Atrial septum: No defect or patent foramen ovale was identified.  A Myoview stress test performed 12/13/09 showed a small anteroapical scar with no ischemia.   ASSESSMENT AND PLAN:   1. CP/STEMI: urgent LHC for further evaluation. Routine TTE  2. HTN: pt currently borderline hypotensive. Can restart home HTN regimen post-procedure and adjust PRN  3. Dyslipidemia: goal LDL<70. As outpt he has been on crestor 40mg  daily. No recent FLP on file; we can check in the AM  Thank you for the opportunity to participate in the care of this patient.  For questions or updates, please contact [facility] Please consult [email or web address] for contact info under   Signed,  Precious Reel, MD, [facility]  03/28/2019 7:19 PM

## 2019-03-28 NOTE — ED Notes (Signed)
Sent bloodwork to lab, istat in minilab

## 2019-03-28 NOTE — Consult Note (Signed)
Resppnded to page when pt came in front door. As was on way to another pt for withdrawal of care, nurse checked and said no family was present for this pt, and she'd call again if any came and chaplain services were desired. Standing by.   Rev. Donnel Saxon Chaplain

## 2019-03-28 NOTE — ED Provider Notes (Signed)
[facility] EMERGENCY DEPARTMENT Provider Note   CSN: [id number] Arrival date & time: 03/28/19  1812    History   Chief Complaint Chief Complaint  Patient presents with   Code STEMI    HPI Eric Ramos is a 58 y.o. male.     Patient with history of STEMI 2010, stent placed, on aspirin, high blood pressure, SVT history presents with recurrent chest pain nausea and diaphoresis.  Patient was seen earlier today and had cardiac/pneumonia evaluation and was feeling better with negative troponin and was sent home for outpatient follow-up.  Patient developed chest discomfort approximately 30 min prior to re-arrival.  Mild radiation left arm.  Patient's cardiologist Dr. Gery Pray.     Past Medical History:  Diagnosis Date   Bradycardia    a. Sinus bradycardia in the 40's during 2010 admission.   Coronary artery disease    a. STEMI 09/2009 s/p PTCA/DES to LAD, residual moderate RCA disease (50-60%). b. Low risk stress test 2011 - scar but no ischemia.   Hx of echocardiogram    a. EF 50-55% at time of STEMI 2010. b. Echo 05/2013: EF 55-60%, normal wall motion, normal echo.   Hyperlipemia    Hypertension    NSVT (nonsustained ventricular tachycardia) (HCC)    a. During 2010 admission for STEMI.    SVT (supraventricular tachycardia) (HCC)    a. 7 beats PSVT by event monitor in 2014.   Syncope    a. Prior episode while having skin lesion removed by dermatologist. b. Recurrence in 2014 while walking to bathroom at [employer] [place]: event monitor ordered but patient only partially wore; BP soft so ACEI d/c'd.    Unexplained weight loss     Patient Active Problem List   Diagnosis Date Noted   NSVT (nonsustained ventricular tachycardia) (HCC)    Bradycardia    SVT (supraventricular tachycardia) (HCC)    Hyperlipidemia 06/24/2013   Anxiety state 06/23/2013   Chronic coronary artery disease 05/25/2013   Syncope 05/25/2013   Essential hypertension  05/25/2013   RMSF [facility] spotted fever) 06/28/2011    Past Surgical History:  Procedure Laterality Date   CARDIAC CATHETERIZATION          Home Medications    Prior to Admission medications   Medication Sig Start Date End Date Taking? Authorizing Provider  aspirin 81 MG EC tablet Take 162 mg by mouth daily.     [provider]  cephALEXin (KEFLEX) 500 MG capsule Take 1 capsule (500 mg total) by mouth 4 (four) times daily. 09/06/16   Hayden Rasmussen, NP  clopidogrel (PLAVIX) 75 MG tablet TAKE 1 TABLET BY MOUTH DAILY 03/06/16   Runell Gess, MD  doxycycline (VIBRAMYCIN) 100 MG capsule Take 1 capsule (100 mg total) by mouth 2 (two) times daily. 03/28/19   Benjiman Core, MD  HYDROcodone-acetaminophen (NORCO/VICODIN) 5-325 MG tablet Take 1-2 tablets by mouth every 6 (six) hours as needed for severe pain. 08/18/16   Gwyneth Sprout, MD  metoprolol tartrate (LOPRESSOR) 25 MG tablet TAKE 1 TABLET BY MOUTH DAILY 03/06/16   Runell Gess, MD  rosuvastatin (CRESTOR) 40 MG tablet TAKE 1 TABLET BY MOUTH DAILY 03/06/16   Runell Gess, MD    Family History Family History  Problem Relation Age of Onset   Heart attack Mother    Cancer Father        liver    Social History Social History   Tobacco Use   Smoking status: Never Smoker  Smokeless tobacco: Never Used  Substance Use Topics   Alcohol use: Yes    Comment: some alcohol   Drug use: No     Allergies   Patient has no known allergies.   Review of Systems Review of Systems  Constitutional: Positive for diaphoresis. Negative for chills and fever.  HENT: Negative for congestion.   Eyes: Negative for visual disturbance.  Respiratory: Negative for shortness of breath.   Cardiovascular: Positive for chest pain.  Gastrointestinal: Positive for nausea. Negative for abdominal pain and vomiting.  Genitourinary: Negative for dysuria and flank pain.  Musculoskeletal: Negative for back pain, neck  pain and neck stiffness.  Skin: Negative for rash.  Neurological: Positive for light-headedness. Negative for headaches.     Physical Exam Updated Vital Signs BP (!) 165/89    Pulse (!) 56    Temp 97.6 F (36.4 C) (Oral)    Resp (!) 22    SpO2 99%   Physical Exam Vitals signs and nursing note reviewed.  Constitutional:      Appearance: He is well-developed. He is ill-appearing.  HENT:     Head: Normocephalic and atraumatic.  Eyes:     General:        Right eye: No discharge.        Left eye: No discharge.     Conjunctiva/sclera: Conjunctivae normal.  Neck:     Musculoskeletal: Normal range of motion and neck supple.     Trachea: No tracheal deviation.  Cardiovascular:     Rate and Rhythm: Regular rhythm. Bradycardia present.  Pulmonary:     Effort: Pulmonary effort is normal.     Breath sounds: Normal breath sounds.  Abdominal:     General: There is no distension.     Palpations: Abdomen is soft.     Tenderness: There is no abdominal tenderness. There is no guarding.  Skin:    General: Skin is warm.     Findings: No rash.  Neurological:     Mental Status: He is alert and oriented to person, place, and time.      ED Treatments / Results  Labs (all labs ordered are listed, but only abnormal results are displayed) Labs Reviewed  CBC WITH DIFFERENTIAL/PLATELET  PROTIME-INR  APTT  COMPREHENSIVE METABOLIC PANEL  TROPONIN I  LIPID PANEL    EKG EKG Interpretation  Date/Time:  Saturday March 28 2019 18:31:02 EDT Ventricular Rate:  59 PR Interval:    QRS Duration: 112 QT Interval:  440 QTC Calculation: 436 R Axis:   65 Text Interpretation:  Sinus rhythm Borderline short PR interval Borderline intraventricular conduction delay Nonspecific repol abnormality, diffuse leads Confirmed by Blane Ohara [phone]) on 03/28/2019 6:35:35 PM   Radiology Dg Chest 2 View  Result Date: 03/28/2019 CLINICAL DATA:  Chest pain. EXAM: CHEST - 2 VIEW COMPARISON:  May 24, 2013  FINDINGS: Mild opacity in left base. A nipple shadow is again seen on the left. The heart, hila, mediastinum are normal. The right lung is clear. No pneumothorax. No other acute abnormalities. IMPRESSION: 1. Mild opacity in the lingula may represent subtle pneumonia/infiltrate. Recommend follow-up to resolution. Electronically Signed   By: Gerome Sam III M.D   On: 03/28/2019 12:46   Dg Chest Port 1 View  Result Date: 03/28/2019 CLINICAL DATA:  Chest pain that began 35 minutes ago EXAM: PORTABLE CHEST 1 VIEW COMPARISON:  03/28/2019 FINDINGS: The heart size and mediastinal contours are within normal limits. Both lungs are clear. The visualized skeletal structures are  unremarkable. IMPRESSION: No active disease. Electronically Signed   By: Elige KoHetal  Patel   On: 03/28/2019 19:10    Procedures .Critical Care Performed by: Blane OharaZavitz, Estaban Mainville, MD Authorized by: Blane OharaZavitz, Patty Lopezgarcia, MD   Critical care provider statement:    Critical care time (minutes):  35   Critical care start time:  03/28/2019 6:28 PM   Critical care end time:  03/28/2019 7:03 PM   Critical care time was exclusive of:  Separately billable procedures and treating other patients and teaching time   Critical care was necessary to treat or prevent imminent or life-threatening deterioration of the following conditions:  Cardiac failure   Critical care was time spent personally by me on the following activities:  Discussions with consultants, evaluation of patient's response to treatment, examination of patient, ordering and performing treatments and interventions, ordering and review of laboratory studies, ordering and review of radiographic studies, pulse oximetry, re-evaluation of patient's condition, obtaining history from patient or surrogate and review of old charts External pacer Date/Time: 03/28/2019 7:40 PM Performed by: Blane OharaZavitz, Felcia Huebert, MD Authorized by: Blane OharaZavitz, Vergie Zahm, MD  Consent: The procedure was performed in an emergent situation.  Verbal consent obtained. Patient identity confirmed: arm band Local anesthesia used: no  Anesthesia: Local anesthesia used: no  Sedation: Patient sedated: no  Comments: Paced for 1 minutes due to bradycardia and hypotension, hr improved, pt more alert after    (including critical care time)  Medications Ordered in ED Medications  0.9 %  sodium chloride infusion (999 mLs Intravenous New Bag/Given 03/28/19 1852)  heparin injection 4,000 Units (4,000 Units Intravenous Given 03/28/19 1835)  fentaNYL (SUBLIMAZE) 100 MCG/2ML injection (  Given 03/28/19 1835)  morphine 4 MG/ML injection 4 mg (4 mg Intravenous Given 03/28/19 1846)     Initial Impression / Assessment and Plan / ED Course  I have reviewed the triage vital signs and the nursing notes.  Pertinent labs & imaging results that were available during my care of the patient were reviewed by me and considered in my medical decision making (see chart for details).       Patient presents with recurrent chest pain and recent visit to the emergency room.  Patient developed worsening symptoms at home and return for further evaluation.  Concern for lateral ST elevation myocardial infarction on initial EKG performed in triage.  Patient brought back to trauma a, repeat EKG performed and reviewed with persistent concerning findings.  Paged code STEMI immediately.  Re-paged interventional cardiologist to discuss.  Discussed the case with on-call cardiology fellow.  Patient given heparin, order set utilized.  Patient had aspirin already today.  Patient's pain worsening, repeat narcotics given fentanyl followed by morphine.  Patient did have episode of lightheadedness/less responsive with low blood pressure and bradycardia, patient required to be paced for less than 1 minute and improved afterwards.  Discussed with cardiology on the phone while at bedside.  Patient taken emergently to the Cath Lab for further evaluation and treatment options.  The  patients results and plan were reviewed and discussed.   Any x-rays performed were independently reviewed by myself.   Differential diagnosis were considered with the presenting HPI.  Medications  0.9 %  sodium chloride infusion (999 mLs Intravenous New Bag/Given 03/28/19 1852)  aspirin EC tablet 81 mg (has no administration in time range)  nitroGLYCERIN (NITROSTAT) SL tablet 0.4 mg (has no administration in time range)  acetaminophen (TYLENOL) tablet 650 mg (has no administration in time range)  ondansetron (ZOFRAN) injection 4 mg (  has no administration in time range)  rosuvastatin (CRESTOR) tablet 40 mg (has no administration in time range)  metoprolol tartrate (LOPRESSOR) tablet 25 mg (has no administration in time range)  lidocaine (PF) (XYLOCAINE) 1 % injection (2 mLs Subcutaneous Given 03/28/19 1937)  midazolam (VERSED) injection (0.5 mg Intravenous Given 03/28/19 1943)  fentaNYL (SUBLIMAZE) injection (25 mcg Intravenous Given 03/28/19 1943)  heparin injection 4,000 Units (4,000 Units Intravenous Given 03/28/19 1835)  fentaNYL (SUBLIMAZE) 100 MCG/2ML injection (  Given 03/28/19 1835)  morphine 4 MG/ML injection 4 mg (4 mg Intravenous Given 03/28/19 1846)    Vitals:   03/28/19 1817 03/28/19 1831 03/28/19 1838 03/28/19 1934  BP: (!) 154/86 (!) 165/89    Pulse: (!) 56  (!) 56   Resp: 18 20 (!) 22   Temp: 97.6 F (36.4 C)     TempSrc: Oral     SpO2: 100%  99% 100%    Final diagnoses:  ST elevation myocardial infarction (STEMI), unspecified artery (HCC)  Symptomatic bradycardia    Admission/ observation were discussed with the admitting physician, patient and/or family and they are comfortable with the plan.    Final Clinical Impressions(s) / ED Diagnoses   Final diagnoses:  None    ED Discharge Orders    None       Blane Ohara, MD 03/28/19 1944

## 2019-03-28 NOTE — ED Provider Notes (Signed)
[facility] Provider Note   CSN: [id number] Arrival date & time: 03/28/19  1141    History   Chief Complaint Chief Complaint  Patient presents with  . Chest Pain    HPI Eric Ramos is a 58 y.o. male.     HPI Patient presents with chest pain.  Dull in his mid chest.  His come on and off for the last week and a half.  States will only come on with exertion.  Lasts about 10 minutes.  Does not resolve quickly when he stops exertion.  Occasional cough with minimal sputum production.  States has had a dry cough since last year but the production to started around the same time as the pain.  No swelling his legs.  No fever.  He has a history of coronary artery disease and previous STEMI.  States has been off his medicines for around a year.  His STEMI was around 10 years ago.  States he had a stress test shortly after that but is not had any other work-up really since then.  States the pain may also come on after eating.  Dr. Gery PrayBarry is the patient's cardiologist. Past Medical History:  Diagnosis Date  . Bradycardia    a. Sinus bradycardia in the 40's during 2010 admission.  . Coronary artery disease    a. STEMI 09/2009 s/p PTCA/DES to LAD, residual moderate RCA disease (50-60%). b. Low risk stress test 2011 - scar but no ischemia.  Marland Kitchen. Hx of echocardiogram    a. EF 50-55% at time of STEMI 2010. b. Echo 05/2013: EF 55-60%, normal wall motion, normal echo.  . Hyperlipemia   . Hypertension   . NSVT (nonsustained ventricular tachycardia) (HCC)    a. During 2010 admission for STEMI.   . SVT (supraventricular tachycardia) (HCC)    a. 7 beats PSVT by event monitor in 2014.  [place] Kitchen. Syncope    a. Prior episode while having skin lesion removed by dermatologist. b. [place] monitor ordered but patient only partially wore; BP soft so ACEI d/c'd.   . Unexplained weight loss     Patient Active Problem  List   Diagnosis Date Noted  . NSVT (nonsustained ventricular tachycardia) (HCC)   . Bradycardia   . SVT (supraventricular tachycardia) (HCC)   . Hyperlipidemia 06/24/2013  . Anxiety state 06/23/2013  . Chronic coronary artery disease 05/25/2013  . Syncope 05/25/2013  . Essential hypertension 05/25/2013  . RMSF [facility] spotted fever) 06/28/2011    Past Surgical History:  Procedure Laterality Date  . CARDIAC CATHETERIZATION          Home Medications    Prior to Admission medications   Medication Sig Start Date End Date Taking? Authorizing Provider  aspirin 81 MG EC tablet Take 162 mg by mouth daily.     [provider]  cephALEXin (KEFLEX) 500 MG capsule Take 1 capsule (500 mg total) by mouth 4 (four) times daily. 09/06/16   Hayden RasmussenMabe, David, NP  clopidogrel (PLAVIX) 75 MG tablet TAKE 1 TABLET BY MOUTH DAILY 03/06/16   Runell GessBerry, Jonathan J, MD  doxycycline (VIBRAMYCIN) 100 MG capsule Take 1 capsule (100 mg total) by mouth 2 (two) times daily. 03/28/19   Benjiman CorePickering, Antwuan Eckley, MD  HYDROcodone-acetaminophen (NORCO/VICODIN) 5-325 MG tablet Take 1-2 tablets by mouth every 6 (six) hours as needed for severe pain. 08/18/16   Gwyneth SproutPlunkett, Whitney, MD  metoprolol tartrate (LOPRESSOR) 25 MG tablet  TAKE 1 TABLET BY MOUTH DAILY 03/06/16   Runell Gess, MD  rosuvastatin (CRESTOR) 40 MG tablet TAKE 1 TABLET BY MOUTH DAILY 03/06/16   Runell Gess, MD    Family History Family History  Problem Relation Age of Onset  . Heart attack Mother   . Cancer Father        liver    Social History Social History   Tobacco Use  . Smoking status: Never Smoker  . Smokeless tobacco: Never Used  Substance Use Topics  . Alcohol use: Yes    Comment: some alcohol  . Drug use: No     Allergies   Patient has no known allergies.   Review of Systems Review of Systems  Constitutional: Negative for appetite change.  HENT: Negative for congestion.   Respiratory: Positive for cough. Negative  for shortness of breath.   Cardiovascular: Positive for chest pain. Negative for leg swelling.  Gastrointestinal: Negative for abdominal pain.  Genitourinary: Negative for flank pain.  Musculoskeletal: Negative for back pain.  Skin: Negative for rash.  Neurological: Negative for weakness.  Psychiatric/Behavioral: Negative for confusion.     Physical Exam Updated Vital Signs BP (!) 146/99   Pulse (!) 55   Temp 98.1 F (36.7 C) (Oral)   Resp 15   Wt 76.7 kg   SpO2 100%   BMI 22.93 kg/m   Physical Exam Vitals signs and nursing note reviewed.  HENT:     Head: Atraumatic.  Neck:     Thyroid: No thyromegaly.  Cardiovascular:     Rate and Rhythm: Normal rate and regular rhythm.  Pulmonary:     Effort: No tachypnea.     Breath sounds: No wheezing, rhonchi or rales.  Abdominal:     Tenderness: There is no abdominal tenderness.  Musculoskeletal:     Right lower leg: He exhibits no tenderness.     Left lower leg: He exhibits no tenderness.  Skin:    General: Skin is warm.     Capillary Refill: Capillary refill takes less than 2 seconds.  Neurological:     General: No focal deficit present.     Mental Status: He is alert.      ED Treatments / Results  Labs (all labs ordered are listed, but only abnormal results are displayed) Labs Reviewed  BASIC METABOLIC PANEL - Abnormal; Notable for the following components:      Result Value   Glucose, Bld 102 (*)    All other components within normal limits  CBC  TROPONIN I  TROPONIN I    EKG EKG Interpretation  Date/Time:  Saturday March 28 2019 11:51:49 EDT Ventricular Rate:  54 PR Interval:    QRS Duration: 109 QT Interval:  412 QTC Calculation: 391 R Axis:   73 Text Interpretation:  Sinus rhythm Short PR interval No significant change since last tracing Confirmed by Benjiman Core [phone]) on 03/28/2019 11:57:14 AM   Radiology Dg Chest 2 View  Result Date: 03/28/2019 CLINICAL DATA:  Chest pain. EXAM: CHEST -  2 VIEW COMPARISON:  May 24, 2013 FINDINGS: Mild opacity in left base. A nipple shadow is again seen on the left. The heart, hila, mediastinum are normal. The right lung is clear. No pneumothorax. No other acute abnormalities. IMPRESSION: 1. Mild opacity in the lingula may represent subtle pneumonia/infiltrate. Recommend follow-up to resolution. Electronically Signed   By: Gerome Sam III M.D   On: 03/28/2019 12:46    Procedures Procedures (including critical care time)  Medications Ordered in ED Medications - No data to display   Initial Impression / Assessment and Plan / ED Course  I have reviewed the triage vital signs and the nursing notes.  Pertinent labs & imaging results that were available during my care of the patient were reviewed by me and considered in my medical decision making (see chart for details).       Patient with chest pain.  Has had a cough that began around the same time with some production.  I think is likely the cause.  X-ray showed possible pneumonia.  EKG reassuring and enzymes stable, however patient likely will need follow-up with cardiologist.  Has been not taking some of his medicines.  Cardiac cause felt less likely.  Discharge home.  Final Clinical Impressions(s) / ED Diagnoses   Final diagnoses:  Community acquired pneumonia, unspecified laterality    ED Discharge Orders         Ordered    doxycycline (VIBRAMYCIN) 100 MG capsule  2 times daily     03/28/19 1529           Benjiman Core, MD 03/28/19 1551

## 2019-03-28 NOTE — ED Triage Notes (Addendum)
Pt in with dull, intermittent central chest pain x 10 days. Worse w/exertion. Denies any n/v, fevers, cough or sob. Hx of MI in 17' with stent placement. Pt states he has not been taking his meds x 1 yr

## 2019-03-28 NOTE — ED Triage Notes (Signed)
PT here with central chest pain that radiates to the left arm.  Hx of MI in 2010.  Was seen here earlier and discharged. Pt states pain was resolved then came back 20 min PTA.  Pt took 324 ASA before coming.  STEMI per Edward Jolly MD.  Cardiologist is Allyson Sabal MD.   18g IV placed in right [facility]  Pt and belongings taken to [facility].  Report given to [employer]

## 2019-03-29 ENCOUNTER — Inpatient Hospital Stay (HOSPITAL_COMMUNITY): Payer: Federal, State, Local not specified - PPO

## 2019-03-29 DIAGNOSIS — I361 Nonrheumatic tricuspid (valve) insufficiency: Secondary | ICD-10-CM

## 2019-03-29 DIAGNOSIS — I2102 ST elevation (STEMI) myocardial infarction involving left anterior descending coronary artery: Principal | ICD-10-CM

## 2019-03-29 LAB — CBC
HCT: 41.9 % (ref 39.0–52.0)
HCT: 42.1 % (ref 39.0–52.0)
Hemoglobin: 13.8 g/dL (ref 13.0–17.0)
Hemoglobin: 13.8 g/dL (ref 13.0–17.0)
MCH: 30.1 pg (ref 26.0–34.0)
MCH: 30 pg (ref 26.0–34.0)
MCHC: 32.9 g/dL (ref 30.0–36.0)
MCHC: 32.8 g/dL (ref 30.0–36.0)
MCV: 91.5 fL (ref 80.0–100.0)
MCV: 91.5 fL (ref 80.0–100.0)
Platelets: 176 10*3/uL (ref 150–400)
Platelets: 195 10*3/uL (ref 150–400)
RBC: 4.58 MIL/uL (ref 4.22–5.81)
RBC: 4.6 MIL/uL (ref 4.22–5.81)
RDW: 13 % (ref 11.5–15.5)
RDW: 13 % (ref 11.5–15.5)
WBC: 8.1 10*3/uL (ref 4.0–10.5)
WBC: 8 10*3/uL (ref 4.0–10.5)
nRBC: 0 % (ref 0.0–0.2)
nRBC: 0 % (ref 0.0–0.2)

## 2019-03-29 LAB — ECHOCARDIOGRAM COMPLETE
Height: 72 in
Weight: 3220.48 oz

## 2019-03-29 LAB — TROPONIN I
Troponin I: 14.54 ng/mL (ref <0.03)
Troponin I: 12.99 ng/mL (ref <0.03)

## 2019-03-29 LAB — BASIC METABOLIC PANEL
Anion gap: 10 (ref 5–15)
BUN: 13 mg/dL (ref 6–20)
CO2: 24 mmol/L (ref 22–32)
Calcium: 8.7 mg/dL — ABNORMAL LOW (ref 8.9–10.3)
Chloride: 102 mmol/L (ref 98–111)
Creatinine, Ser: 0.85 mg/dL (ref 0.61–1.24)
GFR calc Af Amer: >60 mL/min (ref >60)
GFR calc non Af Amer: >60 mL/min (ref >60)
Glucose, Bld: 113 mg/dL — ABNORMAL HIGH (ref 70–99)
Potassium: 4 mmol/L (ref 3.5–5.1)
Sodium: 136 mmol/L (ref 135–145)

## 2019-03-29 LAB — HIV ANTIBODY (ROUTINE TESTING W REFLEX): HIV Screen 4th Generation wRfx: NONREACTIVE

## 2019-03-29 LAB — RAPID URINE DRUG SCREEN, HOSP PERFORMED
Amphetamines: POSITIVE — AB
Barbiturates: NOT DETECTED
Benzodiazepines: POSITIVE — AB
Cocaine: NOT DETECTED
Opiates: POSITIVE — AB
Tetrahydrocannabinol: NOT DETECTED

## 2019-03-29 LAB — LIPID PANEL
Cholesterol: 227 mg/dL — ABNORMAL HIGH (ref 0–200)
HDL: 34 mg/dL — ABNORMAL LOW (ref >40)
LDL Cholesterol: 160 mg/dL — ABNORMAL HIGH (ref 0–99)
Total CHOL/HDL Ratio: 6.7 RATIO
Triglycerides: 165 mg/dL — ABNORMAL HIGH (ref <150)
VLDL: 33 mg/dL (ref 0–40)

## 2019-03-29 LAB — CREATININE, SERUM
Creatinine, Ser: 1.01 mg/dL (ref 0.61–1.24)
GFR calc Af Amer: >60 mL/min (ref >60)
GFR calc non Af Amer: >60 mL/min (ref >60)

## 2019-03-29 LAB — MRSA PCR SCREENING: MRSA by PCR: NEGATIVE

## 2019-03-29 NOTE — Progress Notes (Signed)
  Echocardiogram 2D Echocardiogram has been performed.  Eric Ramos 03/29/2019, 10:27 AM

## 2019-03-29 NOTE — Progress Notes (Signed)
Cardiology Rounding Note   Subjective:    Underwent emergent PCI of 99% LAD lesion last night. (at proximal edge of previous stent)  Denies CP or SOB. Remains bradycardic which is chronic for him.   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     Objective:   Weight Range:  Vital Signs:   Temp:  [97.1 F (36.2 C)-99 F (37.2 C)] 98.3 F (36.8 C) (04/19 0812) Pulse Rate:  [46-99] 50 (04/19 0700) Resp:  [6-39] 12 (04/19 0700) BP: (100-165)/(61-99) 126/82 (04/19 0700) SpO2:  [94 %-100 %] 98 % (04/19 0700) Weight:  [76.7 kg-91.3 kg] 91.3 kg (04/18 2130)    Weight change: Filed Weights   03/28/19 2130  Weight: 91.3 kg    Intake/Output:   Intake/Output Summary (Last 24 hours) at 03/29/2019 0958 Last data filed at 03/29/2019 0800 Gross per 24 hour  Intake 1176.09 ml  Output 1150 ml  Net 26.09 ml     Physical Exam: General:  Well appearing. No resp difficulty HEENT: normal Neck: supple. JVP . Carotids 2+ bilat; no bruits. No lymphadenopathy or thryomegaly appreciated. Cor: PMI nondisplaced. Brady regualr No rubs, gallops or murmurs. Lungs: clear Abdomen: soft, nontender, nondistended. No hepatosplenomegaly. No bruits or masses. Good bowel sounds. Extremities: no cyanosis, clubbing, rash, edema Neuro: alert & orientedx3, cranial nerves grossly intact. moves all 4 extremities w/o difficulty. Affect pleasant  Telemetry: SB 50s Personally reviewed   Labs: Basic Metabolic Panel: Recent Labs  Lab 03/28/19 1153 03/28/19 2146 03/29/19 0300 03/29/19 0834  NA 138 136  --  136  K 4.3 4.1  --  4.0  CL 103 102  --  102  CO2 25 24  --  24  GLUCOSE 102* 114*  --  113*  BUN 19 18  --  13  CREATININE 0.94 0.98 1.01 0.85  CALCIUM 9.1 8.8*  --  8.7*    Liver Function Tests: Recent Labs  Lab 03/28/19 2146  AST 44*  ALT 16  ALKPHOS 69  BILITOT 1.0  PROT 6.4*  ALBUMIN 3.5   No results for input(s): LIPASE, AMYLASE in the last 168 hours. No results for  input(s): AMMONIA in the last 168 hours.  CBC: Recent Labs  Lab 03/28/19 1153 03/28/19 2146 03/29/19 0300 03/29/19 0834  WBC 6.9 10.2 8.1 8.0  NEUTROABS  --  8.8*  --   --   HGB 14.2 13.7 13.8 13.8  HCT 44.9 42.9 41.9 42.1  MCV 91.4 90.5 91.5 91.5  PLT 194 214 176 195    Cardiac Enzymes: Recent Labs  Lab 03/28/19 1153 03/28/19 1412 03/28/19 2146 03/29/19 0300 03/29/19 0834  TROPONINI <0.03 <0.03 3.72* 14.54* 12.99*    BNP: BNP (last 3 results) No results for input(s): BNP in the last 8760 hours.  ProBNP (last 3 results) No results for input(s): PROBNP in the last 8760 hours.    Other results:  Imaging: Dg Chest 2 View  Result Date: 03/28/2019 CLINICAL DATA:  Chest pain. EXAM: CHEST - 2 VIEW COMPARISON:  May 24, 2013 FINDINGS: Mild opacity in left base. A nipple shadow is again seen on the left. The heart, hila, mediastinum are normal. The right lung is clear. No pneumothorax. No other acute abnormalities. IMPRESSION: 1. Mild opacity in the lingula may represent subtle pneumonia/infiltrate. Recommend follow-up to resolution. Electronically Signed   By: Gerome Samavid  Williams III M.D   On: 03/28/2019 12:46   Dg Chest Port 1 View  Result Date: 03/28/2019 CLINICAL  DATA:  Chest pain that began 35 minutes ago EXAM: PORTABLE CHEST 1 VIEW COMPARISON:  03/28/2019 FINDINGS: The heart size and mediastinal contours are within normal limits. Both lungs are clear. The visualized skeletal structures are unremarkable. IMPRESSION: No active disease. Electronically Signed   By: Elige Ko   On: 03/28/2019 19:10      Medications:     Scheduled Medications: . aspirin EC  81 mg Oral Daily  . enoxaparin (LOVENOX) injection  40 mg Subcutaneous Q24H  . prasugrel  10 mg Oral Daily  . rosuvastatin  40 mg Oral Daily  . sodium chloride flush  3 mL Intravenous Q12H     Infusions: . sodium chloride 999 mL (03/28/19 1852)  . 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 Cath with 99% lesion of proximal edge of previous LAD stent. Underwent PCI/DES  Plan/Discussion:     1. CAD s/p anterior STEMI - peak trop 14.5 - s/p DES to LAD - no further CP - EF 45-50% - on DAPT/statin. Recommendation for long-term DAPT - no b-blocker with bradycardia - Can go to floor - CR referral placed  2. HTN - Blood pressure well controlled. Continue current regimen.  3. HL - high dose statin. LDL 160 - May need repatha  Length of Stay: 1   Arvilla Meres MD 03/29/2019, 9:58 AM  Advanced Heart Failure Team Pager [phone] (M-F; 7a - 4p)  Please contact [facility] for night-coverage after hours (4p -7a ) and weekends on [email or web address]

## 2019-03-29 NOTE — Progress Notes (Signed)
Report called to [address]. No chest pain. Ambulated in room to bathroom and tolerated.

## 2019-03-30 ENCOUNTER — Encounter (HOSPITAL_COMMUNITY): Payer: Self-pay | Admitting: Internal Medicine

## 2019-03-30 ENCOUNTER — Telehealth: Payer: Self-pay | Admitting: Cardiology

## 2019-03-30 LAB — POCT ACTIVATED CLOTTING TIME
Activated Clotting Time: 191 seconds
Activated Clotting Time: 208 seconds
Activated Clotting Time: 241 seconds

## 2019-03-30 MED ORDER — ROSUVASTATIN CALCIUM 40 MG PO TABS: 40.0000 mg | ORAL_TABLET | Freq: Every day | ORAL | 6 refills | Status: DC

## 2019-03-30 MED ORDER — PRASUGREL HCL 10 MG PO TABS: 10.0000 mg | ORAL_TABLET | Freq: Every day | ORAL | 11 refills | Status: DC

## 2019-03-30 MED ORDER — NITROGLYCERIN 0.4 MG SL SUBL: 0.4000 mg | SUBLINGUAL_TABLET | SUBLINGUAL | 2 refills | Status: AC | PRN

## 2019-03-30 MED FILL — ROSUVASTATIN CALCIUM 40 MG: 40 | 30 days supply | Qty: 30 | Fill #0 | Status: TO

## 2019-03-30 MED FILL — NITROGLYCERIN 0.4 MG TAB SL: 0.4 | 8 days supply | Qty: 25 | Fill #0 | Status: TO

## 2019-03-30 MED FILL — PRASUGREL HCL 10 MG TABS: 10 | 30 days supply | Qty: 30 | Fill #0 | Status: TO

## 2019-03-30 NOTE — Progress Notes (Signed)
Progress Note  Patient Name: Eric Ramos Date of Encounter: 03/30/2019  Primary Cardiologist: Nanetta Batty, MD   Subjective   Feels well. No chest pain. No dyspnea.   Inpatient Medications    Scheduled Meds: . aspirin EC  81 mg Oral Daily  . enoxaparin (LOVENOX) injection  40 mg Subcutaneous Q24H  . prasugrel  10 mg Oral Daily  . rosuvastatin  40 mg Oral Daily  . sodium chloride flush  3 mL Intravenous Q12H   Continuous Infusions: . sodium chloride 999 mL (03/28/19 1852)  . sodium chloride     PRN Meds: sodium chloride, acetaminophen, nitroGLYCERIN, ondansetron (ZOFRAN) IV, sodium chloride flush   Vital Signs    Vitals:   03/29/19 1152 03/29/19 1243 03/29/19 2150 03/30/19 0645  BP:  122/75 124/79 105/76  Pulse:  (!) 58 (!) 57 (!) 56  Resp:    12  Temp: 98.2 F (36.8 C) 98.3 F (36.8 C) 98.1 F (36.7 C) 98.4 F (36.9 C)  TempSrc: Oral Oral Oral Oral  SpO2:  99% 97% 97%  Weight:    89.2 kg  Height:        Intake/Output Summary (Last 24 hours) at 03/30/2019 0913 Last data filed at 03/30/2019 0500 Gross per 24 hour  Intake 240 ml  Output -  Net 240 ml   Last 3 Weights 03/30/2019 03/28/2019 03/28/2019  Weight (lbs) 196 lb 9.6 oz 201 lb 4.5 oz 169 lb 1.5 oz  Weight (kg) 89.177 kg 91.3 kg 76.7 kg      Telemetry    Sinus brady. - Personally Reviewed  ECG    Yesterday- NSR with mild T wave inversion anteriorly. - Personally Reviewed  Physical Exam   GEN: No acute distress.   Neck: No JVD Cardiac: RRR, no murmurs, rubs, or gallops.  Respiratory: Clear to auscultation bilaterally. GI: Soft, nontender, non-distended  MS: No edema; No deformity. Neuro:  Nonfocal  Psych: Normal affect   Labs    Chemistry Recent Labs  Lab 03/28/19 1153 03/28/19 2146 03/29/19 0300 03/29/19 0834  NA 138 136  --  136  K 4.3 4.1  --  4.0  CL 103 102  --  102  CO2 25 24  --  24  GLUCOSE 102* 114*  --  113*  BUN 19 18  --  13  CREATININE 0.94 0.98 1.01 0.85   CALCIUM 9.1 8.8*  --  8.7*  PROT  --  6.4*  --   --   ALBUMIN  --  3.5  --   --   AST  --  44*  --   --   ALT  --  16  --   --   ALKPHOS  --  69  --   --   BILITOT  --  1.0  --   --   GFRNONAA >60 >60 >60 >60  GFRAA >60 >60 >60 >60  ANIONGAP 10 10  --  10     Hematology Recent Labs  Lab 03/28/19 2146 03/29/19 0300 03/29/19 0834  WBC 10.2 8.1 8.0  RBC 4.74 4.58 4.60  HGB 13.7 13.8 13.8  HCT 42.9 41.9 42.1  MCV 90.5 91.5 91.5  MCH 28.9 30.1 30.0  MCHC 31.9 32.9 32.8  RDW 12.7 13.0 13.0  PLT 214 176 195    Cardiac Enzymes Recent Labs  Lab 03/28/19 1412 03/28/19 2146 03/29/19 0300 03/29/19 0834  TROPONINI <0.03 3.72* 14.54* 12.99*   No results for input(s): TROPIPOC in  the last 168 hours.   BNPNo results for input(s): BNP, PROBNP in the last 168 hours.   DDimer No results for input(s): DDIMER in the last 168 hours.   Radiology    Dg Chest 2 View  Result Date: 03/28/2019 CLINICAL DATA:  Chest pain. EXAM: CHEST - 2 VIEW COMPARISON:  May 24, 2013 FINDINGS: Mild opacity in left base. A nipple shadow is again seen on the left. The heart, hila, mediastinum are normal. The right lung is clear. No pneumothorax. No other acute abnormalities. IMPRESSION: 1. Mild opacity in the lingula may represent subtle pneumonia/infiltrate. Recommend follow-up to resolution. Electronically Signed   By: Gerome Samavid  Williams III M.D   On: 03/28/2019 12:46   Dg Chest Port 1 View  Result Date: 03/28/2019 CLINICAL DATA:  Chest pain that began 35 minutes ago EXAM: PORTABLE CHEST 1 VIEW COMPARISON:  03/28/2019 FINDINGS: The heart size and mediastinal contours are within normal limits. Both lungs are clear. The visualized skeletal structures are unremarkable. IMPRESSION: No active disease. Electronically Signed   By: Elige KoHetal  Patel   On: 03/28/2019 19:10    Cardiac Studies   Procedures   Coronary/Graft Acute MI Revascularization  LEFT HEART CATH AND CORONARY ANGIOGRAPHY  Conclusion    Conclusions: 1. Severe single-vessel coronary artery disease with 99% mid LAD stenosis at the proximal edge of previously placed stent.  There is moderate disease in the LAD extending back to the large first septal branch. 2. Mild to moderate LCx and RCA disease, similar to prior catheterization in 2010. 3. Mildly reduced left ventricular systolic function with mid and apical anterior hypokinesis (LVEF 45-50%). 4. Normal left ventricular filling pressure. 5. Successful PCI to proximal/mid LAD using overlapping Resolute Onyx 3.0 x 12 mm (proximal - postdilated to 3.4 mm) and 3.0 x 26 mm (distal) drug eluting stents, which overlap with the prior mid LAD stent placed in 2010.  Proximal stent was placed due to edge dissection after initial stent placements.  Final angiogram show 0% residual stenosis with TIMI-3 flow.  Recommendations: 1. Dual antiplatelet therapy with aspirin and prasugrel for at least 12 months.  I favor long-term DAPT given long stented segment in the proximal and mid LAD. 2. Aggressive secondary prevention.  Medication compliance will need to be reinforced, as the patient has not been taking any medications for about a year.  Yvonne Kendallhristopher End, MD [facility] Pager: [phone]) [phone]    Echo: IMPRESSIONS    1. The left ventricle has normal systolic function, with an ejection fraction of 55-60%. The cavity size was normal. Left ventricular diastolic function could not be evaluated due to nondiagnostic images.  2. The right ventricle has normal systolic function. The cavity was normal. There is no increase in right ventricular wall thickness.  3. The aortic valve is tricuspid. Mild sclerosis of the aortic valve. Aortic valve regurgitation was not assessed by color flow Doppler.  Patient Profile     58 y.o. male with prior stent of LAD in 2010 and HLD presented with acute anterior STEMI.   Assessment & Plan    1. CAD s/p anterior STEMI - peak trop 14.5 - s/p  overlapping DES to LAD - no further CP - EF 45-50% by cath. 55% by Echo. - on DAPT/statin. Recommendation for long-term DAPT. Now on Effient and ASA. Stressed importance of compliance. Would recommend DAPT long term given multiple layers of stent in LAD - no b-blocker with bradycardia -- CR referral placed -- patient is stable for DC today.  2. HTN - Blood pressure well controlled on no medication  3. HL - high dose statin. LDL 160 -- has not taken medication for one year. Will resume Crestor 40 mg daily. - May need repatha if not at goal < 70  4. UDS + for amphetamines. Patient denies any illicit drug use.   For questions or updates, please contact [facility] Please consult [email or web address] for contact info under        Signed, Jaelynn Currier [place], MD  03/30/2019, 9:13 AM

## 2019-03-30 NOTE — Discharge Summary (Signed)
Discharge Summary    Patient ID: Eric Ramos,  MRN: [id number], DOB/AGE: [birth year] 60 y.o.  Admit date: 03/28/2019 Discharge date: 03/30/2019  Primary Care Provider: System, Provider Not In Primary Cardiologist: Dr. Allyson Sabal   Discharge Diagnoses    Principal Problem:   STEMI (ST elevation myocardial infarction) [facility]) Active Problems:   Essential hypertension   Hyperlipidemia   Bradycardia   STEMI involving left anterior descending coronary artery (HCC)  Allergies No Known Allergies  Diagnostic Studies/Procedures    Cath: 03/28/19  Conclusions: 1. Severe single-vessel coronary artery disease with 99% mid LAD stenosis at the proximal edge of previously placed stent.  There is moderate disease in the LAD extending back to the large first septal branch. 2. Mild to moderate LCx and RCA disease, similar to prior catheterization in 2010. 3. Mildly reduced left ventricular systolic function with mid and apical anterior hypokinesis (LVEF 45-50%). 4. Normal left ventricular filling pressure. 5. Successful PCI to proximal/mid LAD using overlapping Resolute Onyx 3.0 x 12 mm (proximal - postdilated to 3.4 mm) and 3.0 x 26 mm (distal) drug eluting stents, which overlap with the prior mid LAD stent placed in 2010.  Proximal stent was placed due to edge dissection after initial stent placements.  Final angiogram show 0% residual stenosis with TIMI-3 flow.  Recommendations: 1. Dual antiplatelet therapy with aspirin and prasugrel for at least 12 months.  I favor long-term DAPT given long stented segment in the proximal and mid LAD. 2. Aggressive secondary prevention.  Medication compliance will need to be reinforced, as the patient has not been taking any medications for about a year.  Yvonne Kendall, MD  TTE: 03/29/19  IMPRESSIONS   1. The left ventricle has normal systolic function, with an ejection fraction of 55-60%. The cavity size was normal. Left ventricular diastolic  function could not be evaluated due to nondiagnostic images.  2. The right ventricle has normal systolic function. The cavity was normal. There is no increase in right ventricular wall thickness.  3. The aortic valve is tricuspid. Mild sclerosis of the aortic valve. Aortic valve regurgitation was not assessed by color flow Doppler. _____________   History of Present Illness     58 y.o. male w/ h/o CAD, s/p PCI in 2010 to LAD subsequent to STEMI, with residual moderate dz in the RCA at that time (50-60%) who presented to ED w/ CP and acute EKG changes. Pt said he had been having intermittent chest discomfort for the past week or so. The pain would come on with exertion and was midsternal pressure or dull pain; it usually lasted for about 10 minutes or so and then would go away with rest. There was occasional associated SOB. The day of admission the pain was longer in duration and more severe, so he came to ED for evaluation. EKG showed ST elevation in I, aVL, and V2 w/ reciprocal ST depression and TWI in the inferior leads. Initial trop negative. Repeat EKG looked better, but pt acutely became bradycardic into the 30s-40s while in the ED w/ SBP in the 80s. The bradycardia subsequently resolved shortly thereafter w/o intervention. Developed recurrence of chest pain and was taken directly to the cath lab.   Hospital Course     Consultants: none  1. CAD s/p anterior STEMI: peak trop 14.5. Underwent cardiac cath noted above with overlapping DES to LAD. EF 45-50% by cath. 55% by Echo. - on DAPT with ASA/Effient. Recommendation for long-term DAPT. Would recommend DAPT long term  given multiple layers of stent in LAD.Stressed importance of compliance. Unable to add BB 2/2 bradycardia. -- CR referral placed and completed via phone in regards to COVID precautions.   2. HTN: -Blood pressure well controlled on no medication  3. HL: high dose statin. LDL 160 -- had not taken medication for one year and was  restarted on Crestor 40 mg daily. - May need repatha if not at goal < 70  4. UDS: + for amphetamines. Patient denies any illicit drug use.    Eric Ramos was seen by Dr. [place] and determined stable for discharge home. Follow up in the office has been arranged. Medications are listed below.   _____________  Discharge Vitals Blood pressure 105/76, pulse (!) 56, temperature 98.4 F (36.9 C), temperature source Oral, resp. rate 12, height 6' (1.829 m), weight 89.2 kg, SpO2 97 %.  Filed Weights   03/28/19 2130 03/30/19 0645  Weight: 91.3 kg 89.2 kg    Labs & Radiologic Studies    CBC Recent Labs    03/28/19 2146 03/29/19 0300 03/29/19 0834  WBC 10.2 8.1 8.0  NEUTROABS 8.8*  --   --   HGB 13.7 13.8 13.8  HCT 42.9 41.9 42.1  MCV 90.5 91.5 91.5  PLT 214 176 195   Basic Metabolic Panel Recent Labs    01/00/71 2146 03/29/19 0300 03/29/19 0834  NA 136  --  136  K 4.1  --  4.0  CL 102  --  102  CO2 24  --  24  GLUCOSE 114*  --  113*  BUN 18  --  13  CREATININE 0.98 1.01 0.85  CALCIUM 8.8*  --  8.7*   Liver Function Tests Recent Labs    03/28/19 2146  AST 44*  ALT 16  ALKPHOS 69  BILITOT 1.0  PROT 6.4*  ALBUMIN 3.5   No results for input(s): LIPASE, AMYLASE in the last 72 hours. Cardiac Enzymes Recent Labs    03/28/19 2146 03/29/19 0300 03/29/19 0834  TROPONINI 3.72* 14.54* 12.99*   BNP Invalid input(s): POCBNP D-Dimer No results for input(s): DDIMER in the last 72 hours. Hemoglobin A1C No results for input(s): HGBA1C in the last 72 hours. Fasting Lipid Panel Recent Labs    03/29/19 0300  CHOL 227*  HDL 34*  LDLCALC 160*  TRIG 165*  CHOLHDL 6.7   Thyroid Function Tests Recent Labs    03/28/19 2146  TSH 4.064   _____________  Dg Chest 2 View  Result Date: 03/28/2019 CLINICAL DATA:  Chest pain. EXAM: CHEST - 2 VIEW COMPARISON:  May 24, 2013 FINDINGS: Mild opacity in left base. A nipple shadow is again seen on the left. The heart,  hila, mediastinum are normal. The right lung is clear. No pneumothorax. No other acute abnormalities. IMPRESSION: 1. Mild opacity in the lingula may represent subtle pneumonia/infiltrate. Recommend follow-up to resolution. Electronically Signed   By: Gerome Sam III M.D   On: 03/28/2019 12:46   Dg Chest Port 1 View  Result Date: 03/28/2019 CLINICAL DATA:  Chest pain that began 35 minutes ago EXAM: PORTABLE CHEST 1 VIEW COMPARISON:  03/28/2019 FINDINGS: The heart size and mediastinal contours are within normal limits. Both lungs are clear. The visualized skeletal structures are unremarkable. IMPRESSION: No active disease. Electronically Signed   By: Elige Ko   On: 03/28/2019 19:10   Disposition   Pt is being discharged home today in good condition.  Follow-up Plans & Appointments    Follow-up  Information    Abelino DerrickKilroy, Luke K, PA-C Follow up on 04/09/2019.   Specialties:  Cardiology, Radiology Why:  at 10:30am for your follow up appt. This will be arranged as a tele-health visit with your smart phone.  Contact information: [address] [place] [address] [phone]          Discharge Instructions    Amb Referral to Cardiac Rehabilitation   Complete by:  As directed    Diagnosis:   STEMI Coronary Stents     Call MD for:  redness, tenderness, or signs of infection (pain, swelling, redness, odor or green/yellow discharge around incision site)   Complete by:  As directed    Diet - low sodium heart healthy   Complete by:  As directed    Discharge instructions   Complete by:  As directed    Radial Site Care Refer to this sheet in the next few weeks. These instructions provide you with information on caring for yourself after your procedure. Your caregiver may also give you more specific instructions. Your treatment has been planned according to current medical practices, but problems sometimes occur. Call your caregiver if you have any problems or questions after  your procedure. HOME CARE INSTRUCTIONS You may shower the day after the procedure.Remove the bandage (dressing) and gently wash the site with plain soap and water.Gently pat the site dry.  Do not apply powder or lotion to the site.  Do not submerge the affected site in water for 3 to 5 days.  Inspect the site at least twice daily.  Do not flex or bend the affected arm for 24 hours.  No lifting over 5 pounds (2.3 kg) for 5 days after your procedure.  Do not drive home if you are discharged the same day of the procedure. Have someone else drive you.  You may drive 24 hours after the procedure unless otherwise instructed by your caregiver.  What to expect: Any bruising will usually fade within 1 to 2 weeks.  Blood that collects in the tissue (hematoma) may be painful to the touch. It should usually decrease in size and tenderness within 1 to 2 weeks.  SEEK IMMEDIATE MEDICAL CARE IF: You have unusual pain at the radial site.  You have redness, warmth, swelling, or pain at the radial site.  You have drainage (other than a small amount of blood on the dressing).  You have chills.  You have a fever or persistent symptoms for more than 72 hours.  You have a fever and your symptoms suddenly get worse.  Your arm becomes pale, cool, tingly, or numb.  You have heavy bleeding from the site. Hold pressure on the site.   PLEASE DO NOT MISS ANY DOSES OF YOUR EFFIENT!!!!! Also keep a log of you blood pressures and bring back to your follow up appt. Please call the office with any questions.   Patients taking blood thinners should generally stay away from medicines like ibuprofen, Advil, Motrin, naproxen, and Aleve due to risk of stomach bleeding. You may take Tylenol as directed or talk to your primary doctor about alternatives.   Increase activity slowly   Complete by:  As directed        Discharge Medications     Medication List    STOP taking these medications   cephALEXin 500 MG  capsule Commonly known as:  KEFLEX   clopidogrel 75 MG tablet Commonly known as:  PLAVIX   doxycycline 100 MG capsule Commonly known  as:  VIBRAMYCIN   HYDROcodone-acetaminophen 5-325 MG tablet Commonly known as:  NORCO/VICODIN   ibuprofen 200 MG tablet Commonly known as:  ADVIL   metoprolol tartrate 25 MG tablet Commonly known as:  LOPRESSOR     TAKE these medications   aspirin 81 MG EC tablet Take 162 mg by mouth daily.   nitroGLYCERIN 0.4 MG SL tablet Commonly known as:  NITROSTAT Place 1 tablet (0.4 mg total) under the tongue every 5 (five) minutes x 3 doses as needed for chest pain.   prasugrel 10 MG Tabs tablet Commonly known as:  EFFIENT Take 1 tablet (10 mg total) by mouth daily. Start taking on:  March 31, 2019   rosuvastatin 40 MG tablet Commonly known as:  CRESTOR Take 1 tablet (40 mg total) by mouth daily.        Acute coronary syndrome (MI, NSTEMI, STEMI, etc) this admission?: Yes.     AHA/ACC Clinical Performance & Quality Measures: 1. Aspirin prescribed? - Yes 2. ADP Receptor Inhibitor (Plavix/Clopidogrel, Brilinta/Ticagrelor or Effient/Prasugrel) prescribed (includes medically managed patients)? - Yes 3. Beta Blocker prescribed? - No - bradycardia 4. High Intensity Statin (Lipitor 40-80mg  or Crestor 20-40mg ) prescribed? - Yes 5. EF assessed during THIS hospitalization? - Yes 6. For EF <40%, was ACEI/ARB prescribed? - Not Applicable (EF >/= 40%) 7. For EF <40%, Aldosterone Antagonist (Spironolactone or Eplerenone) prescribed? - Not Applicable (EF >/= 40%) 8. Cardiac Rehab Phase II ordered (Included Medically managed Patients)? - Yes      Outstanding Labs/Studies   FLP/LFTs in 6 weeks  Duration of Discharge Encounter   Greater than 30 minutes including physician time.  Signed, Laverda Page NP-C 03/30/2019, 10:25 AM

## 2019-03-30 NOTE — Plan of Care (Signed)
  Problem: Activity: Goal: Ability to tolerate increased activity will improve Outcome: Completed/Met   Problem: Cardiac: Goal: Ability to achieve and maintain adequate cardiopulmonary perfusion will improve Outcome: Completed/Met   Problem: Cardiac: Goal: Vascular access site(s) Level 0-1 will be maintained Outcome: Completed/Met   Problem: Health Behavior/Discharge Planning: Goal: Ability to safely manage health-related needs after discharge will improve Outcome: Completed/Met

## 2019-03-30 NOTE — TOC Benefit Eligibility Note (Signed)
Transition of Care [facility]) Benefit Eligibility Note    Patient Details  Name: [name] MRN: [id number] Date of Birth: [birth year]   Medication/Dose: EFFIENT   10 MG DAILY(PRASUGREL 10 MG DAILY  CO-PAY-  $ 7.50 TIER- 1 DRUG)  Covered?: Yes  Tier: 3 Drug  Prescription Coverage Preferred Pharmacy: YES([facility] AND [facility])  Spoke with Person/Company/Phone Number:: [employer](F.E[employer]P RX # [phone] )  Co-Pay: $ 226.00  Prior Approval: No  Deductible: Met(OUT-OF POCKET- NOT MET)       Memory Argue Phone Number: 03/30/2019, 12:14 PM

## 2019-03-30 NOTE — Telephone Encounter (Signed)
   TELEPHONE CALL NOTE  This patient has been deemed a candidate for follow-up tele-health visit to limit community exposure during the Covid-19 pandemic. I spoke with the patient via phone to discuss instructions. This has been outlined on the patient's AVS (dotphrase: hcevisitinfo). The patient was advised to review the section on consent for treatment as well. The patient will receive a phone call 2-3 days prior to their E-Visit at which time consent will be verbally confirmed. A Virtual Office Visit appointment type has been scheduled for 04/09/19 with Corine Shelter, with "VIDEO" or "TELEPHONE" in the appointment notes - patient prefers VIDEO type.  I have either confirmed the patient is active in MyChart or offered to send sign-up link to phone/email via Mychart icon beside patient's photo.  Laverda Page, NP 03/30/2019 10:16 AM

## 2019-03-30 NOTE — Progress Notes (Signed)
Cardiac Rehab Advisory Cardiac Rehab Phase I is not seeing pts face to face at this time due to Covid 19 restrictions. Ambulation is occurring through nursing, PT, and mobility teams. We will help facilitate that process as needed. We are calling pts in their rooms and discussing education. We will then deliver education materials to pts RN for delivery to pt.  [phone] MI education completed by phone. Pt voiced understanding. Stressed importance of effient with stent. Reviewed NTG use, MI restrictions, heart healthy food choices, ex guidelines, CRP 2. Referred to [place] program. 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 Do not know if work schedule will allow pt to attend but will have CRP 2 follow up. Encouraged pt to take his meds and he stated he would.  Will give written materials and MI booklet to staff to give to pt. Ex guidelines are written out for pt. Luetta Nutting RN BSN 03/30/2019 8:09 AM

## 2019-03-30 NOTE — Discharge Instructions (Signed)
YOUR CARDIOLOGY TEAM HAS ARRANGED FOR AN E-VISIT FOR YOUR APPOINTMENT - PLEASE REVIEW IMPORTANT INFORMATION BELOW SEVERAL DAYS PRIOR TO YOUR APPOINTMENT  Due to the recent COVID-19 pandemic, we are transitioning in-person office visits to tele-medicine visits in an effort to decrease unnecessary exposure to our patients, their families, and staff. These visits are billed to your insurance just like a normal visit is. We also encourage you to sign up for MyChart if you have not already done so. You will need a smartphone if possible. For patients that do not have this, we can still complete the visit using a regular telephone but do prefer a smartphone to enable video when possible. You may have a family member that lives with you that can help. If possible, we also ask that you have a blood pressure cuff and scale at home to measure your blood pressure, heart rate and weight prior to your scheduled appointment. Patients with clinical needs that need an in-person evaluation and testing will still be able to come to the office if absolutely necessary. If you have any questions, feel free to call our office.     YOUR PROVIDER WILL BE USING THE FOLLOWING PLATFORM TO COMPLETE YOUR VISIT:   Acute Coronary Syndrome  Acute coronary syndrome (ACS) is a serious problem in which there is suddenly not enough blood and oxygen reaching the heart. ACS can result in chest pain or a heart attack. This condition is a medical emergency. If you have any symptoms of this condition, get help right away. What are the causes? This condition may be caused by:  Buildup of fat and cholesterol inside of the arteries (atherosclerosis). This is the most common cause. The buildup (plaque) can cause blood vessels in the heart (coronary arteries) to become narrow or blocked, which reduces blood flow to the heart. Plaque can also break off and lead to a clot, which can block an artery and cause a heart attack or stroke.  Sudden  tightening of the muscles around the coronary arteries (coronary spasm).  Tearing of a coronary artery (spontaneous coronary artery dissection).  Very low blood pressure (hypotension).  An abnormal heartbeat (arrhythmia).  Other medical conditions that cause a decrease of oxygen to the heart, such as anemiaorrespiratory failure.  Using cocaine or methamphetamine. What increases the risk? The following factors may make you more likely to develop this condition:  Age. The risk for ACS increases as you get older.  History of chest pain, heart attack, peripheral artery disease, or stroke.  Having taken chemotherapy or immune-suppressing medicines.  Being male.  Family history of chest pain, heart disease, or stroke.  Smoking.  Not exercising enough.  Being overweight.  High cholesterol.  High blood pressure (hypertension).  Diabetes.  Excessive alcohol use. What are the signs or symptoms? Common symptoms of this condition include:  Chest pain. The pain may last a long time, or it may stop and come back (recur). It may feel like: ? Crushing or squeezing. ? Tightness, pressure, fullness, or heaviness.  Arm, neck, jaw, or back pain.  Heartburn or indigestion.  Shortness of breath.  Nausea.  Sudden cold sweats.  Light-headedness.  Dizziness, or passing out.  Tiredness (fatigue). Sometimes there are no symptoms. How is this diagnosed? This condition may be diagnosed based on:  Your medical history and symptoms.  An electrocardiogram (ECG). This imaging test measures the heart's electrical activity.  Blood tests. Cardiac blood tests may need to be repeated at designated time intervals.  Chest X-ray.  A CT scan of the chest.  A coronary angiogram. This is a procedure in which dye is injected into the bloodstream and then X-rays are taken to show if there is a blockage in a coronary artery.  Exercise stress testing.  Echocardiography. This is a test  that uses sound waves to produce detailed images of the heart. How is this treated? The treatment is to restore blood flow to the heart as soon as possible. Treatment for this condition may include:  Oxygen therapy.  Medicines, such as: ? Antiplatelet medicines and blood-thinning medicines, such as aspirin. These help prevent blood clots. ? Medicine that dissolves any blood clots (fibrinolytic therapy). ? Blood pressure medicines. ? Nitroglycerin. This helps relieve chest pain and widens blood vessels to improve blood flow. ? Pain medicine. ? Cholesterol-lowering medicine.  Surgery, such as: ? Coronary angioplasty with stent placement. This involves placing a small piece of metal that looks like mesh or a spring into a narrow coronary artery. This widens the artery and keep it open. ? Coronary artery bypass surgery. This involves taking a section of a blood vessel from a different part of your body, and placing it on the blocked coronary artery to allow blood to flow around (bypass) the blockage.  Cardiac rehabilitation. This is a program that helps improve your health and well-being. It includes exercise training, education, and counseling to help you recover. Follow these instructions at home: Eating and drinking  Eat a heart-healthy diet that includes whole grains, fruits and vegetables, lean proteins, and low-fat or nonfat dairy products.  Limit how much salt (sodium) you eat as told by your health care provider. Follow instructions from your health care provider about any other eating or drinking restrictions, such as limiting foods that are high in fat and processed sugars.  Use healthy cooking methods such as roasting, grilling, broiling, baking, poaching, steaming, or stir-frying.  Talk with a dietitian to learn about healthy cooking methods and how to eat less sodium. Medicines  Take over-the-counter and prescription medicines only as told by your health care provider.  Do  not take these medicines unless your health care provider approves: ? Vitamin supplements that contain vitamin A or vitamin E. ? Nonsteroidal anti-inflammatory drugs (NSAIDs), such as ibuprofen, naproxen, or celecoxib. ? Hormone replacement therapy that contains estrogen. If you are taking blood thinners:  Talk with your health care provider before you take any medicines that contain aspirin or NSAIDs. These medicines increase your risk for dangerous bleeding.  Take your medicine exactly as told, at the same time every day.  Avoid activities that could cause injury or bruising, and follow instructions about how to prevent falls.  Wear a medical alert bracelet, and carry a card that lists what medicines you take. Activity  Join a cardiac rehabilitation program. An exercise plan will be developed for you.  Ask your health care provider: ? What activities and exercises are safe for you. ? If you should follow specific instructions about lifting, driving, or climbing stairs. Lifestyle  Do not use any products that contain nicotine or tobacco, such as cigarettes and e-cigarettes. If you need help quitting, ask your health care provider.  If your health care provider says that alcohol is safe for you, limit your alcohol intake to no more than 1 drink a day. One drink equals 12 oz of beer, 5 oz of wine, or 1 oz of hard liquor.  Maintain a healthy weight. If you need to lose weight,  work with your health care provider to do so safely. General instructions  Tell all the health care providers who care for you about your heart condition, including your dentist. This may affect the medicines or treatment you receive.  Manage any other health conditions you have, such as hypertension or diabetes. These conditions affect your heart.  Learn ways to manage stress.  Get screened for depression, and get mental health treatment if you need it. People with ACS are at higher risk for  depression.  Keep your vaccinations up to date. Get the flu shot (influenza vaccine) every year.  If directed, monitor your blood pressure at home.  Keep all follow-up visits as told by your health care provider. This is important. Contact a health care provider if:  You feel overwhelmed or sad.  You have trouble doing your daily activities. Get help right away if:  You have pain in your chest, neck, arm, jaw, stomach, or back that recurs, and: ? It lasts for more than a few minutes. ? It is not relieved by taking the medicineyour health care provider prescribed.  You have unexplained: ? Heavy sweating. ? Heartburn or indigestion. ? Nausea or vomiting. ? Shortness of breath. ? Difficulty breathing. ? Fatigue. ? Nervousness or anxiety. ? Weakness. ? Diarrhea. ? Dark stools or blood in your stool.  You have sudden light-headedness or dizziness.  Your blood pressure is higher than 180/120.  You faint.  You have thoughts about hurting yourself. These symptoms may represent a serious problem that is an emergency. Do not wait to see if the symptoms will go away. Get medical help right away. Call your local emergency services (911 in the [place] Do not drive yourself to the hospital. If you ever feel like you may hurt yourself or others, or have thoughts about taking your own life, get help right away. You can go to your nearest emergency department or call:  Emergency services (911 in the [place]  A suicide crisis helpline, such as the National Suicide Prevention Lifeline at 716-075-7520. This is open 24 hours a day. Summary  Acute coronary syndrome (ACS) is when there is not enough blood and oxygen being supplied to the heart. ACS can result in chest pain or a heart attack.  Acute coronary syndrome is a medical emergency. If you have any symptoms of this condition, get help right away.  Treatment includes medicines and procedures to open the blocked arteries and restore  blood flow. This information is not intended to replace advice given to you by your health care provider. Make sure you discuss any questions you have with your health care provider. Document Released: 11/26/2005 Document Revised: 08/06/2017 Document Reviewed: 08/06/2017 Elsevier Interactive Patient Education  2019 [employer]. Doxy.Me   IF USING WEBEX - How to Download the WebEx App to Your SmartPhone  - If Apple device, go to [employer] and type in WebEx in the search bar. Download [employer], the blue/green circle. If Android, go to [employer] and type in [employer] in the search bar. The app is free but as with any other app download, your phone may require you to verify saved payment information or Apple/Android password.  - You do NOT have to create an account. - On the day of the visit, our staff will walk you through joining the meeting with the meeting number/password.   IF USING MYCHART - How to Download the MyChart App to Your SmartPhone   - If Apple, go  to [employer] and type in MyChart in the search bar and download the app. If Android, ask patient to go to [employer] Play Store and type in [facility] in the search bar and download the app. The app is free but as with any other app downloads, your phone may require you to verify saved payment information or Apple/Android password.  - You will need to then log into the app with your MyChart username and password, and select [facility] as your healthcare provider to link the account. When it is time for your visit, go to the MyChart app, find appointments, and click Begin Video Visit. Be sure to Select Allow for your device to access the Microphone and Camera for your visit. You will then be connected, and your provider will be with you shortly.  **If you have any issues connecting or need assistance, please contact MyChart service desk [phone])**  **If using a computer, in order to ensure the best quality for  your visit, you will need to use either of the following Internet Browsers: [employer] Edge, or Google Chrome**   IF USING DOXIMITY or DOXY.ME - The staff will give you instructions on receiving your link to join the meeting the day of your visit.      2-3 DAYS BEFORE YOUR APPOINTMENT  You will receive a telephone call from one of our HeartCare team members - your caller ID may say "Unknown caller." If this is a video visit, we will walk you through how to set up your device to be able to complete the visit. We will remind you check your blood pressure, heart rate and weight prior to your scheduled appointment. If you have an Apple Watch or Kardia, please upload any pertinent ECG strips the day before or morning of your appointment to MyChart. Our staff will also make sure you have reviewed the consent and agree to move forward with your scheduled tele-health visit.     THE DAY OF YOUR APPOINTMENT  Approximately 15 minutes prior to your scheduled appointment, you will receive a telephone call from one of HeartCare team - your caller ID may say "Unknown caller."  Our staff will confirm medications, vital signs for the day and any symptoms you may be experiencing. Please have this information available prior to the time of visit start. It may also be helpful for you to have a pad of paper and pen handy for any instructions given during your visit. They will also walk you through joining the smartphone meeting if this is a video visit.    CONSENT FOR TELE-HEALTH VISIT - PLEASE REVIEW  I hereby voluntarily request, consent and authorize [facility] and its employed or contracted physicians, physician assistants, nurse practitioners or other licensed health care professionals (the Practitioner), to provide me with telemedicine health care services (the Services") as deemed necessary by the treating Practitioner. I acknowledge and consent to receive the Services by the Practitioner via  telemedicine. I understand that the telemedicine visit will involve communicating with the Practitioner through live audiovisual communication technology and the disclosure of certain medical information by electronic transmission. I acknowledge that I have been given the opportunity to request an in-person assessment or other available alternative prior to the telemedicine visit and am voluntarily participating in the telemedicine visit.  I understand that I have the right to withhold or withdraw my consent to the use of telemedicine in the course of my care at any time, without affecting my right to future  care or treatment, and that the Practitioner or I may terminate the telemedicine visit at any time. I understand that I have the right to inspect all information obtained and/or recorded in the course of the telemedicine visit and may receive copies of available information for a reasonable fee.  I understand that some of the potential risks of receiving the Services via telemedicine include:   Delay or interruption in medical evaluation due to technological equipment failure or disruption;  Information transmitted may not be sufficient (e.g. poor resolution of images) to allow for appropriate medical decision making by the Practitioner; and/or   In rare instances, security protocols could fail, causing a breach of personal health information.  Furthermore, I acknowledge that it is my responsibility to provide information about my medical history, conditions and care that is complete and accurate to the best of my ability. I acknowledge that Practitioner's advice, recommendations, and/or decision may be based on factors not within their control, such as incomplete or inaccurate data provided by me or distortions of diagnostic images or specimens that may result from electronic transmissions. I understand that the practice of medicine is not an exact science and that Practitioner makes no warranties or  guarantees regarding treatment outcomes. I acknowledge that I will receive a copy of this consent concurrently upon execution via email to the email address I last provided but may also request a printed copy by calling the office of [facility].    I understand that my insurance will be billed for this visit.   I have read or had this consent read to me.  I understand the contents of this consent, which adequately explains the benefits and risks of the Services being provided via telemedicine.   I have been provided ample opportunity to ask questions regarding this consent and the Services and have had my questions answered to my satisfaction.  I give my informed consent for the services to be provided through the use of telemedicine in my medical care  By participating in this telemedicine visit I agree to the above.

## 2019-03-30 NOTE — Care Management (Addendum)
1052 03-30-19 Plan for TOC to deliver medications to the patients bedside.    5597 03-30-19 Benefits Check submitted for Effient. Patient has insurance- will make aware of co pay once completed. No further needs from CM at this time. Gala Lewandowsky, RN,BSN Case Manager [phone]

## 2019-03-30 NOTE — Progress Notes (Signed)
Discharge instructions (including medications) discussed with and copy provided to patient/caregiver 

## 2019-04-08 ENCOUNTER — Telehealth: Payer: Self-pay | Admitting: Cardiology

## 2019-04-08 NOTE — Telephone Encounter (Signed)
Smartphone/consent/ my chart/ pre reg completed °

## 2019-04-09 ENCOUNTER — Encounter: Payer: Self-pay | Admitting: Cardiology

## 2019-04-09 ENCOUNTER — Telehealth (INDEPENDENT_AMBULATORY_CARE_PROVIDER_SITE_OTHER): Payer: Federal, State, Local not specified - PPO | Admitting: Cardiology

## 2019-04-09 ENCOUNTER — Telehealth: Payer: Self-pay

## 2019-04-09 VITALS — Ht 73.05 in | Wt 199.0 lb

## 2019-04-09 DIAGNOSIS — Z91199 Patient's noncompliance with other medical treatment and regimen due to unspecified reason: Secondary | ICD-10-CM

## 2019-04-09 DIAGNOSIS — Z9861 Coronary angioplasty status: Secondary | ICD-10-CM

## 2019-04-09 DIAGNOSIS — I2510 Atherosclerotic heart disease of native coronary artery without angina pectoris: Secondary | ICD-10-CM

## 2019-04-09 DIAGNOSIS — I2102 ST elevation (STEMI) myocardial infarction involving left anterior descending coronary artery: Secondary | ICD-10-CM

## 2019-04-09 DIAGNOSIS — Z9119 Patient's noncompliance with other medical treatment and regimen: Secondary | ICD-10-CM | POA: Insufficient documentation

## 2019-04-09 DIAGNOSIS — E785 Hyperlipidemia, unspecified: Secondary | ICD-10-CM

## 2019-04-09 DIAGNOSIS — I10 Essential (primary) hypertension: Secondary | ICD-10-CM

## 2019-04-09 NOTE — Patient Instructions (Addendum)
Medication Instructions:  Your physician recommends that you continue on your current medications as directed. Please refer to the Current Medication list given to you today. If you need a refill on your cardiac medications before your next appointment, please call your pharmacy.   Lab work: None  If you have labs (blood work) drawn today and your tests are completely normal, you will receive your results only by: Marland Kitchen MyChart Message (if you have MyChart) OR . A paper copy in the mail If you have any lab test that is abnormal or we need to change your treatment, we will call you to review the results.  Testing/Procedures: None   Follow-Up: At [facility], you and your health needs are our priority.  As part of our continuing mission to provide you with exceptional heart care, we have created designated Provider Care Teams.  These Care Teams include your primary Cardiologist (physician) and Advanced Practice Providers (APPs -  Physician Assistants and Nurse Practitioners) who all work together to provide you with the care you need, when you need it. You will need a follow up appointment in 4 months.  Please call our office 2 months in advance to schedule this appointment.  You may see Nanetta Batty, MD or one of the following Advanced Practice Providers on your designated Care Team:   Corine Shelter, PA-C Judy Pimple, [place] . Marjie Skiff, PA-C  Any Other Special Instructions Will Be Listed Below (If Applicable). Return to work note attached Copy of [employer] sheet mailed to patient as well for his FMLA forms

## 2019-04-09 NOTE — Progress Notes (Signed)
Virtual Visit via Video Note   This visit type was conducted due to national recommendations for restrictions regarding the COVID-19 Pandemic (e.g. social distancing) in an effort to limit this patient's exposure and mitigate transmission in our community.  Due to his co-morbid illnesses, this patient is at least at moderate risk for complications without adequate follow up.  This format is felt to be most appropriate for this patient at this time.  All issues noted in this document were discussed and addressed.  A limited physical exam was performed with this format.  Please refer to the patient's chart for his consent to telehealth for [facility].  Evaluation Performed:  Follow-up visit  This visit type was conducted due to national recommendations for restrictions regarding the COVID-19 Pandemic (e.g. social distancing).  This format is felt to be most appropriate for this patient at this time.  All issues noted in this document were discussed and addressed.  No physical exam was performed (except for noted visual exam findings with Video Visits).  Please refer to the patient's chart (MyChart message for video visits and phone note for telephone visits) for the patient's consent to telehealth for [facility].  Date:  04/09/2019   ID:  Eric Severancehomas R Martino, DOB 10-Aug-1961, MRN [id number]  Patient Location: Home [address] [place] [id number]   Provider location:   Home- [place]  PCP:  System, Provider Not In  Cardiologist:  Nanetta BattyJonathan Berry, MD  Electrophysiologist:  None   Chief Complaint:  Post STEMI follow up  History of Present Illness:    Eric Ramos is a 58 y.o. male who presents via audio/video conferencing for a telehealth visit today.  He has a history of coronary disease.  He had an anterior MI in 2010 and received an LAD DES.  We had seen him a few times in follow-up but not for some time.  He presented to the emergency room 03/28/2019 as an anterior STEMI.  He  had transient hypotension and bradycardia in the emergency room that resolved without treatment.  He was taken urgently to the Cath Lab where catheterization revealed patency of the previously placed mid LAD stent, and 80% proximal LAD stenosis, and 90% LAD at the takeoff of a small diagonal.  The RCA also had a 60% mid narrowing.  He underwent PCI with 3 DES placed.  His ejection fraction by echo was 45 to 50%.  His troponin peaked at 14.5.  He was not discharged on beta-blocker because of baseline bradycardia.  Other labs during his hospitalization showed an LDL of 160.  The patient admitted he has been out of his medications for more than a year.  Dr. Okey DupreEnd has recommended lifelong DAPT.  The patient was contacted today for a video follow-up.  He has been doing well since discharge.  He is tolerating all his medications.  He has not had chest pain.  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  He asked about going back to work and I suggested he stay out for 6 weeks.  He will contact us if he has any issues with his medications or any issues with symptoms.  I suggested he be up and around, walking but no strenuous activity.  We will need to see him back in the office for an EKG and physical exam as soon as feasible under the current COVID pandemic restrictions.  The patient does not symptoms  concerning for COVID-19 infection (fever, chills, cough, or new SHORTNESS OF BREATH).    Prior CV studies:   The following studies were reviewed today: Cath/ PCI 03/28/2019  Past Medical History:  Diagnosis Date  . Bradycardia    a. Sinus bradycardia in the 40's during 2010 admission.  . Coronary artery disease    a. STEMI 09/2009 s/p PTCA/DES to LAD, residual moderate RCA disease (50-60%). b. Low risk stress test 2011 - scar but no ischemia.  Marland Kitchen Hx of echocardiogram    a. EF 50-55% at time of STEMI 2010. b. Echo 05/2013: EF 55-60%, normal wall motion, normal echo.   . Hyperlipemia   . Hypertension   . NSVT (nonsustained ventricular tachycardia) (HCC)    a. During 2010 admission for STEMI.   . SVT (supraventricular tachycardia) (HCC)    a. 7 beats PSVT by event monitor in 2014.  Marland Kitchen Syncope    a. Prior episode while having skin lesion removed by dermatologist. b. Recurrence in 2014 while walking to bathroom at [employer]: event monitor ordered but patient only partially wore; BP soft so ACEI d/c'd.   . Unexplained weight loss    Past Surgical History:  Procedure Laterality Date  . CARDIAC CATHETERIZATION    . CORONARY/GRAFT ACUTE MI REVASCULARIZATION N/A 03/28/2019   Procedure: Coronary/Graft Acute MI Revascularization;  Surgeon: Yvonne Kendall, MD;  Location: [facility] INVASIVE CV LAB;  Service: Cardiovascular;  Laterality: N/A;  . LEFT HEART CATH AND CORONARY ANGIOGRAPHY N/A 03/28/2019   Procedure: LEFT HEART CATH AND CORONARY ANGIOGRAPHY;  Surgeon: Yvonne Kendall, MD;  Location: [facility] INVASIVE CV LAB;  Service: Cardiovascular;  Laterality: N/A;     Current Meds  Medication Sig  . aspirin 81 MG EC tablet Take 162 mg by mouth daily.   . nitroGLYCERIN (NITROSTAT) 0.4 MG SL tablet Place 1 tablet (0.4 mg total) under the tongue every 5 (five) minutes x 3 doses as needed for chest pain.  . prasugrel (EFFIENT) 10 MG TABS tablet Take 1 tablet (10 mg total) by mouth daily.  . rosuvastatin (CRESTOR) 40 MG tablet Take 1 tablet (40 mg total) by mouth daily.     Allergies:   Patient has no known allergies.   Social History   Tobacco Use  . Smoking status: Never Smoker  . Smokeless tobacco: Never Used  Substance Use Topics  . Alcohol use: Yes    Comment: some alcohol  . Drug use: No     Family Hx: The patient's family history includes Cancer in his father; Heart attack in his mother.  ROS:   Please see the history of present illness.    All other systems reviewed and are negative.   Labs/Other Tests and Data Reviewed:    Recent Labs:  03/28/2019: ALT 16; TSH 4.064 03/29/2019: BUN 13; Creatinine, Ser 0.85; Hemoglobin 13.8; Platelets 195; Potassium 4.0; Sodium 136   Recent Lipid Panel Lab Results  Component Value Date/Time   CHOL 227 (H) 03/29/2019 03:00 AM   TRIG 165 (H) 03/29/2019 03:00 AM   HDL 34 (L) 03/29/2019 03:00 AM   CHOLHDL 6.7 03/29/2019 03:00 AM   LDLCALC 160 (H) 03/29/2019 03:00 AM    Wt Readings from Last 3 Encounters:  04/09/19 199 lb (90.3 kg)  03/30/19 196 lb 9.6 oz (89.2 kg)  03/28/19 169 lb 1.5 oz (76.7 kg)     Exam:    Vital Signs:  Ht 6' 1.05" (1.855 m)   Wt 199 lb (90.3 kg)   BMI 26.22  kg/m    Well nourished, well developed male in no acute distress.   ASSESSMENT & PLAN:    STEMI- H/O STEMI Dec 2010- LAD DES S/P recent STEMI 03/28/2019- new LAD DES (3).  CAD s/p PCI- mLAD DES 2010 p-mLAD DES x 3 03/28/2019, residual 60% mRCA  ICM- EF 45-50%  Dyslipidemia- LDL 160- placed on high dose statin Rx, check f/u lipids in 6-8 weeks    COVID-19 Education: The signs and symptoms of COVID-19 were discussed with the patient and how to seek care for testing (follow up with PCP or arrange E-visit).  The importance of social distancing was discussed today.  Patient Risk:   After full review of this patients clinical status, I feel that they are at least moderate risk at this time.  Time:   Today, I have spent 25 minutes with the patient with telehealth technology discussing CAD, MI, COVID, and medical Rx.     Medication Adjustments/Labs and Tests Ordered: Current medicines are reviewed at length with the patient today.  Concerns regarding medicines are outlined above.  Tests Ordered: No orders of the defined types were placed in this encounter.  Medication Changes: No orders of the defined types were placed in this encounter.   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  He will need an OV as soon as we are seeing patients in the office.   Order LFTs and lipids then  Signed, Corine Shelter, PA-C  04/09/2019 10:59 AM    [facility]

## 2019-04-09 NOTE — Telephone Encounter (Signed)
Contacted patient and discussed AVS instructions. Patient voiced understanding about return to work letter, and [employer] information sheet and follow up appt.

## 2019-04-14 ENCOUNTER — Telehealth (HOSPITAL_COMMUNITY): Payer: Self-pay | Admitting: *Deleted

## 2019-04-14 NOTE — Telephone Encounter (Signed)
Called and left message for pt regarding  Cardiac Rehab continued closure due to adherence of national recommendation for Covid -19  in group setting.  Inquired about his exercise and any further educational needs he may have.  Requested a call back. Alanson Aly, BSN Cardiac and Emergency planning/management officer

## 2019-05-05 ENCOUNTER — Telehealth: Payer: Self-pay | Admitting: Cardiology

## 2019-05-05 NOTE — Telephone Encounter (Signed)
05/05/2019 Received signed FMLA Form back  from [employer] on patient I then inter- office it back to [employer] to finish processing . cbr

## 2019-06-05 ENCOUNTER — Telehealth (HOSPITAL_COMMUNITY): Payer: Self-pay

## 2019-06-05 NOTE — Telephone Encounter (Signed)
No response from pt, closed referral. °

## 2019-07-28 ENCOUNTER — Other Ambulatory Visit: Payer: Self-pay

## 2019-07-28 ENCOUNTER — Encounter: Payer: Self-pay | Admitting: Cardiovascular Disease

## 2019-07-28 ENCOUNTER — Ambulatory Visit: Payer: Federal, State, Local not specified - PPO | Admitting: Cardiovascular Disease

## 2019-07-28 VITALS — BP 120/69 | HR 61 | Ht 72.0 in | Wt 191.4 lb

## 2019-07-28 DIAGNOSIS — E785 Hyperlipidemia, unspecified: Secondary | ICD-10-CM

## 2019-07-28 DIAGNOSIS — I10 Essential (primary) hypertension: Secondary | ICD-10-CM

## 2019-07-28 NOTE — Assessment & Plan Note (Signed)
History of essential hypertension her blood pressure measured today at 120/69.  He is not on antihypertensive medications.

## 2019-07-28 NOTE — Assessment & Plan Note (Signed)
History of CAD status post anterior MI 09/16/2009 treated with drug-eluting stenting to the proximal LAD.  He had moderate disease in the RCA anteroapical hypokinesia.  Myoview stress test performed 12/13/2009 showed a small anteroapical scar without ischemia and 2D echo October 2010 showed an EF of 50 to 55%.  He returned with chest pain 03/28/2019 underwent cardiac catheterization by Dr. end revealing high-grade proximal mid LAD stenosis.  He was stented with 2 drug-eluting stents.  The previously placed stent was widely patent.  EF was preserved.  He is on aspirin and Prasugrel.

## 2019-07-28 NOTE — Assessment & Plan Note (Signed)
History of dyslipidemia on high-dose Crestor.  Lipid profile performed the day after admission 03/29/2019 revealed total cholesterol 227, LDL 160 and HDL 34.  I am going to recheck a lipid liver profile.

## 2019-07-28 NOTE — Patient Instructions (Signed)
Medication Instructions:  Your physician recommends that you continue on your current medications as directed. Please refer to the Current Medication list given to you today.  If you need a refill on your cardiac medications before your next appointment, please call your pharmacy.   Lab work: Your physician recommends that you return for lab work in: 1-2 weeks (FASTING LIPID AND LIVER PANELS)  If you have labs (blood work) drawn today and your tests are completely normal, you will receive your results only by: Marland Kitchen MyChart Message (if you have MyChart) OR . A paper copy in the mail If you have any lab test that is abnormal or we need to change your treatment, we will call you to review the results.  Testing/Procedures: NONE  Follow-Up: At [facility], you and your health needs are our priority.  As part of our continuing mission to provide you with exceptional heart care, we have created designated Provider Care Teams.  These Care Teams include your primary Cardiologist (physician) and Advanced Practice Providers (APPs -  Physician Assistants and Nurse Practitioners) who all work together to provide you with the care you need, when you need it. You will need a follow up appointment in 6 months with Kerin Ransom, PA-C and in 12 months with Dr. Quay Burow.  Please call our office 2 months in advance to schedule each appointment.

## 2019-07-28 NOTE — Progress Notes (Signed)
07/28/2019 Eric Severancehomas R Brummond   1961/03/05  [id number]  Primary Physician System, Provider Not In Primary Cardiologist: Runell GessJonathan J  MD Milagros LollFACP, FACC, [place], [place]  HPI:  Eric Ramos is a 58 y.o.  fit appearing male with a history coronary disease, hypertension, hyperlipidemia. I last saw him in the office 06/28/2015. On 09/16/2009 patient suffered an anterior wall MI received a drug-eluting stent to the proximal LAD. He had moderate disease in the right coronary artery with anterior apical hypokinesia. His last Myoview stress test was 12/13/2009 showed small anterior apical scar with no ischemia. 2-D echocardiogram was October 2010 showed ejection fraction of 50-55%  Patient was seen yesterday at  [facility] emergency room after having a syncopal episode while waiting for a table at [employer]. Patient states he started feeling warm and seeing spots and was on his way to the bathroom when he had a syncopal event. He also noted some chills the night before. He reports some nausea and dizziness prior to the event. He also reports a similar episode approximately 2 years ago after having a fever. 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 Also reports a decrease in weight of approximately 10-13 pounds in the last 2 months. Also reports more activity and lifting weights more frequently he also typically plays full court basketball on Thursday evenings. His father had liver cancer. [facility] for acute abnormality. Troponin was negative. Since he was seen by Huey BienenstockBrian Hager PA-C the past one month ago he's had one syncopal episode while having a skin lesion removed by dermatologist. A 30 day event monitor was ordered but are partially he took off during the procedure. A 2-D echo was normal. I have counseled him about not driving for 6 months after a syncopal episode without etiology and he is aware of that. 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  He been having chest pain for 2 weeks before that.  He underwent acute and intervention by Dr. Okey DupreEnd revealing high-grade segmental mid and proximal LAD stenosis and had 2 drug-eluting stents placed in overlapping fashion.  The previously placed LAD stent was widely patent.  He had a 60% mid RCA stenosis, anteroapical wall motion abnormalities and EF of 45 to 50%.  He was discharged home 2 days later on aspirin and Prasugrel.  He said no recurrent symptoms.    Current Meds  Medication Sig  . aspirin 81 MG EC tablet Take 162 mg by mouth daily.   . nitroGLYCERIN (NITROSTAT) 0.4 MG SL tablet Place 1 tablet (0.4 mg total) under the tongue every 5 (five) minutes x 3 doses as needed for chest pain.  . prasugrel (EFFIENT) 10 MG TABS tablet Take 1 tablet (10 mg total) by mouth daily.  . rosuvastatin (CRESTOR) 40 MG tablet Take 1 tablet (40 mg total) by mouth daily.     No Known Allergies  Social History   Socioeconomic History  . Marital status: Divorced    Spouse name: Not on file  . Number of children: Not on file  . Years of education: Not on file  . Highest education level: Not on file  Occupational History  . Not on file  Social Needs  . Financial resource strain: Not on file  . Food insecurity    Worry: Not on file  Inability: Not on file  . Transportation needs    Medical: Not on file    Non-medical: Not on file  Tobacco Use  . Smoking status: Never Smoker  . Smokeless tobacco: Never Used  Substance and Sexual Activity  . Alcohol use: Yes    Comment: some alcohol  . Drug use: No  . Sexual activity: Not on file  Lifestyle  . Physical activity    Days per week: Not on file    Minutes per session: Not on file  . Stress: Not on file  Relationships  . Social Herbalist on phone: Not on file    Gets together: Not on file    Attends religious service: Not on file    Active member of club  or organization: Not on file    Attends meetings of clubs or organizations: Not on file    Relationship status: Not on file  . Intimate partner violence    Fear of current or ex partner: Not on file    Emotionally abused: Not on file    Physically abused: Not on file    Forced sexual activity: Not on file  Other Topics Concern  . Not on file  Social History Narrative  . Not on file     Review of Systems: General: negative for chills, fever, night sweats or weight changes.  Cardiovascular: negative for chest pain, dyspnea on exertion, edema, orthopnea, palpitations, paroxysmal nocturnal dyspnea or shortness of breath Dermatological: negative for rash Respiratory: negative for cough or wheezing Urologic: negative for hematuria Abdominal: negative for nausea, vomiting, diarrhea, bright red blood per rectum, melena, or hematemesis Neurologic: negative for visual changes, syncope, or dizziness All other systems reviewed and are otherwise negative except as noted above.    Blood pressure 120/69, pulse 61, height 6' (1.829 m), weight 191 lb 6.4 oz (86.8 kg), SpO2 98 %.  General appearance: alert and no distress Neck: no adenopathy, no carotid bruit, no JVD, supple, symmetrical, trachea midline and thyroid not enlarged, symmetric, no tenderness/mass/nodules Lungs: clear to auscultation bilaterally Heart: regular rate and rhythm, S1, S2 normal, no murmur, click, rub or gallop Extremities: extremities normal, atraumatic, no cyanosis or edema Pulses: 2+ and symmetric Skin: Skin color, texture, turgor normal. No rashes or lesions Neurologic: Alert and oriented X 3, normal strength and tone. Normal symmetric reflexes. Normal coordination and gait  EKG not performed today  ASSESSMENT AND PLAN:   CAD S/P percutaneous coronary angioplasty History of CAD status post anterior MI 09/16/2009 treated with drug-eluting stenting to the proximal LAD.  He had moderate disease in the RCA anteroapical  hypokinesia.  Myoview stress test performed 12/13/2009 showed a small anteroapical scar without ischemia and 2D echo October 2010 showed an EF of 50 to 55%.  He returned with chest pain 03/28/2019 underwent cardiac catheterization by Dr. end revealing high-grade proximal mid LAD stenosis.  He was stented with 2 drug-eluting stents.  The previously placed stent was widely patent.  EF was preserved.  He is on aspirin and Prasugrel.  Essential hypertension History of essential hypertension her blood pressure measured today at 120/69.  He is not on antihypertensive medications.  Dyslipidemia, goal LDL below 70 History of dyslipidemia on high-dose Crestor.  Lipid profile performed the day after admission 03/29/2019 revealed total cholesterol 227, LDL 160 and HDL 34.  I am going to recheck a lipid liver profile.      Lorretta Harp MD FACP,FACC,FAHA, [facility] 07/28/2019 5:09 PM

## 2019-08-11 LAB — HEPATIC FUNCTION PANEL
ALT: 20 IU/L (ref 0–44)
AST: 21 IU/L (ref 0–40)
Albumin: 4.5 g/dL (ref 3.8–4.9)
Alkaline Phosphatase: 67 IU/L (ref 39–117)
Bilirubin Total: 0.6 mg/dL (ref 0.0–1.2)
Bilirubin, Direct: 0.19 mg/dL (ref 0.00–0.40)
Total Protein: 6.9 g/dL (ref 6.0–8.5)

## 2019-08-11 LAB — LIPID PANEL
Chol/HDL Ratio: 2.8 ratio (ref 0.0–5.0)
Cholesterol, Total: 129 mg/dL (ref 100–199)
HDL: 46 mg/dL (ref >39)
LDL Chol Calc (NIH): 69 mg/dL (ref 0–99)
Triglycerides: 71 mg/dL (ref 0–149)
VLDL Cholesterol Cal: 14 mg/dL (ref 5–40)

## 2019-08-12 ENCOUNTER — Encounter: Payer: Self-pay | Admitting: *Deleted

## 2020-02-25 ENCOUNTER — Other Ambulatory Visit: Payer: Self-pay | Admitting: Cardiology

## 2020-02-26 NOTE — Telephone Encounter (Signed)
This Dr. Hazle Coca pt

## 2020-04-08 ENCOUNTER — Other Ambulatory Visit: Payer: Self-pay | Admitting: Cardiology

## 2020-04-11 NOTE — Telephone Encounter (Signed)
Rx has been sent to the pharmacy electronically. ° °

## 2021-03-14 DIAGNOSIS — H2511 Age-related nuclear cataract, right eye: Secondary | ICD-10-CM | POA: Diagnosis not present

## 2021-03-14 DIAGNOSIS — H2513 Age-related nuclear cataract, bilateral: Secondary | ICD-10-CM | POA: Diagnosis not present

## 2021-03-14 DIAGNOSIS — H02835 Dermatochalasis of left lower eyelid: Secondary | ICD-10-CM | POA: Diagnosis not present

## 2021-03-14 DIAGNOSIS — H02832 Dermatochalasis of right lower eyelid: Secondary | ICD-10-CM | POA: Diagnosis not present

## 2021-03-14 DIAGNOSIS — H25013 Cortical age-related cataract, bilateral: Secondary | ICD-10-CM | POA: Diagnosis not present

## 2021-04-17 ENCOUNTER — Telehealth: Payer: Self-pay | Admitting: *Deleted

## 2021-04-17 NOTE — Telephone Encounter (Signed)
   [facility] Pre-operative Risk Assessment    Patient Name: PENNY FRISBIE  DOB: 1961-01-22  MRN: [id number]   HEARTCARE STAFF: - Please ensure there is not already an duplicate clearance open for this procedure. - Under Visit Info/Reason for Call, type in Other and utilize the format Clearance MM/DD/YY or Clearance TBD. Do not use dashes or single digits. - If request is for dental extraction, please clarify the # of teeth to be extracted.  Request for surgical clearance:  1. What type of surgery is being performed? Cataract extraction w/intraocular lens implantation of the right eye followed by the left eye  2. When is this surgery scheduled? 05/29/21  3. What type of clearance is required (medical clearance vs. Pharmacy clearance to hold med vs. Both)? medical  4. Are there any medications that need to be held prior to surgery and how long?none  5. Practice name and name of physician performing surgery? [facility]   6. What is the office phone number? [phone]   7.   What is the office fax number? [phone]  8.   Anesthesia type (None, local, MAC, general) ? Not listed   Fredia Beets 04/17/2021, 12:51 PM  _________________________________________________________________   (provider comments below)

## 2021-04-17 NOTE — Telephone Encounter (Signed)
   Patient Name: Eric Ramos  DOB: 07-Apr-1961 MRN: [id number]  Primary Cardiologist: Nanetta Batty, MD  Chart reviewed as part of pre-operative protocol coverage. Cataract extractions are recognized in guidelines as low risk surgeries that do not typically require specific preoperative testing or holding of blood thinner therapy. Therefore, given past medical history and time since last visit, based on ACC/AHA guidelines, Eric Ramos would be at acceptable risk for the planned procedure without further cardiovascular testing.   I will route this recommendation to the requesting party via Epic fax function and remove from pre-op pool.  Please call with questions.  Alver Sorrow, NP 04/17/2021, 1:57 PM

## 2021-05-01 ENCOUNTER — Other Ambulatory Visit: Payer: Self-pay

## 2021-05-01 MED ORDER — PRASUGREL HCL 10 MG PO TABS: 10.0000 mg | ORAL_TABLET | Freq: Every day | ORAL | 4 refills | Status: DC

## 2021-05-29 DIAGNOSIS — H2511 Age-related nuclear cataract, right eye: Secondary | ICD-10-CM | POA: Diagnosis not present

## 2021-05-30 DIAGNOSIS — H2512 Age-related nuclear cataract, left eye: Secondary | ICD-10-CM | POA: Diagnosis not present

## 2021-06-19 DIAGNOSIS — H2512 Age-related nuclear cataract, left eye: Secondary | ICD-10-CM | POA: Diagnosis not present

## 2021-06-27 ENCOUNTER — Ambulatory Visit: Payer: Federal, State, Local not specified - PPO | Admitting: Cardiovascular Disease

## 2021-06-27 ENCOUNTER — Encounter: Payer: Self-pay | Admitting: Cardiovascular Disease

## 2021-06-27 ENCOUNTER — Other Ambulatory Visit: Payer: Self-pay

## 2021-06-27 DIAGNOSIS — I2510 Atherosclerotic heart disease of native coronary artery without angina pectoris: Secondary | ICD-10-CM | POA: Diagnosis not present

## 2021-06-27 DIAGNOSIS — H2513 Age-related nuclear cataract, bilateral: Secondary | ICD-10-CM | POA: Diagnosis not present

## 2021-06-27 DIAGNOSIS — E785 Hyperlipidemia, unspecified: Secondary | ICD-10-CM

## 2021-06-27 DIAGNOSIS — Z9861 Coronary angioplasty status: Secondary | ICD-10-CM

## 2021-06-27 DIAGNOSIS — I10 Essential (primary) hypertension: Secondary | ICD-10-CM

## 2021-06-27 MED ORDER — CLOPIDOGREL BISULFATE 75 MG PO TABS: 75.0000 mg | ORAL_TABLET | Freq: Every day | ORAL | 1 refills | Status: DC

## 2021-06-27 NOTE — Assessment & Plan Note (Signed)
History of essential hypertension blood pressure measured today 116/60.  He is not on antihypertensive medications.

## 2021-06-27 NOTE — Progress Notes (Signed)
06/27/2021 Eric Ramos   06/01/1961  [id number]  Primary Physician System, Provider Not In Primary Cardiologist: Runell Gess MD Milagros Loll, [place], [place]  HPI:  Eric Ramos is a 60 y.o.  it appearing male with a history coronary disease, hypertension, hyperlipidemia. I last saw him in the office 07/28/2019. On 09/16/2009 patient suffered an anterior wall MI received a drug-eluting stent to the proximal LAD. He had moderate disease in the right coronary artery with anterior apical hypokinesia. His last Myoview stress test was 12/13/2009 showed small anterior apical scar with no ischemia. 2-D echocardiogram was October 2010 showed ejection fraction of 50-55%  He was seen in the [facility] emergency room after having a syncopal episode while waiting for a table at [employer]. Patient states he started feeling warm and seeing spots and was on his way to the bathroom when he had a syncopal event. He also noted some chills the night before. He reports some nausea and dizziness prior to the event. He also reports a similar episode approximately 2 years ago after having a fever. 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 Also reports a decrease in weight of approximately 10-13 pounds in the last 2 months. Also reports more activity and lifting weights more frequently he also typically plays full court basketball on Thursday evenings. His father had liver cancer. Head [facility] was negative for acute abnormality. Troponin was negative. Since he was seen by Huey Bienenstock PA-C the past one month ago he's had one syncopal episode while having a skin lesion removed by dermatologist. A 30 day event monitor was ordered but are partially he took off during the procedure. A 2-D echo was normal. I have counseled him about not driving for 6 months after a syncopal episode without etiology and he is aware of that. 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  He been having chest pain for 2 weeks before that.  He underwent acute and intervention by Dr. Okey Dupre revealing high-grade segmental mid and proximal LAD stenosis and had 2 drug-eluting stents placed in overlapping fashion.  The previously placed LAD stent was widely patent.  He had a 60% mid RCA stenosis, anteroapical wall motion abnormalities and EF of 45 to 50%.  He was discharged home 2 days later on aspirin and Prasugrel.    Since I saw him 2 years ago he is remained stable.  He denies chest pain or shortness of breath.  Current Meds  Medication Sig   aspirin 81 MG EC tablet Take 162 mg by mouth daily.    nitroGLYCERIN (NITROSTAT) 0.4 MG SL tablet Place 1 tablet (0.4 mg total) under the tongue every 5 (five) minutes x 3 doses as needed for chest pain.   rosuvastatin (CRESTOR) 40 MG tablet TAKE ONE TABLET BY MOUTH DAILY   [DISCONTINUED] prasugrel (EFFIENT) 10 MG TABS tablet Take 1 tablet (10 mg total) by mouth daily.     No Known Allergies  Social History   Socioeconomic History   Marital status: Divorced    Spouse name: Not on file   Number of children: Not on file   Years of education: Not on file   Highest education level: Not on file  Occupational History   Not on file  Tobacco Use   Smoking status: Never   Smokeless tobacco: Never  Substance and Sexual Activity   Alcohol  use: Yes    Comment: some alcohol   Drug use: No   Sexual activity: Not on file  Other Topics Concern   Not on file  Social History Narrative   Not on file   Social Determinants of Health   Financial Resource Strain: Not on file  Food Insecurity: Not on file  Transportation Needs: Not on file  Physical Activity: Not on file  Stress: Not on file  Social Connections: Not on file  Intimate Partner Violence: Not on file     Review of Systems: General: negative for chills, fever, night sweats or weight changes.  Cardiovascular: negative for chest pain, dyspnea on exertion,  edema, orthopnea, palpitations, paroxysmal nocturnal dyspnea or shortness of breath Dermatological: negative for rash Respiratory: negative for cough or wheezing Urologic: negative for hematuria Abdominal: negative for nausea, vomiting, diarrhea, bright red blood per rectum, melena, or hematemesis Neurologic: negative for visual changes, syncope, or dizziness All other systems reviewed and are otherwise negative except as noted above.    Blood pressure 116/60, pulse 62, height 6' (1.829 m), weight 207 lb (93.9 kg).  General appearance: alert and no distress Neck: no adenopathy, no carotid bruit, no JVD, supple, symmetrical, trachea midline, and thyroid not enlarged, symmetric, no tenderness/mass/nodules Lungs: clear to auscultation bilaterally Heart: regular rate and rhythm, S1, S2 normal, no murmur, click, rub or gallop Extremities: extremities normal, atraumatic, no cyanosis or edema Pulses: 2+ and symmetric Skin: Skin color, texture, turgor normal. No rashes or lesions Neurologic: Grossly normal  EKG sinus rhythm at 62 without ST or T wave changes.  Personally reviewed this EKG.  ASSESSMENT AND PLAN:   CAD S/P percutaneous coronary angioplasty History of CAD status post anterior MI 09/16/2009 treated with a drug-eluting stent to the proximal LAD.  He had moderate RCA disease at that time with anteroapical hypokinesia.  Subsequent Myoview stress test performed 12/13/2009 showed a small anterior apical scar with no ischemia 2D echo revealed an EF of 50 to 55%.  He presented with an anterior MI 03/28/2019 and underwent acute intervention by Dr. Okey Dupre revealing high-grade segmental proximal LAD stenosis.  2 additional drug-eluting stents were placed in overlapping fashion.  The previously placed LAD stent was patent.  He had stable 60% mid RCA stenosis with interval apical wall motion abnormality and EF of 45 to 50%.  He was discharged home on aspirin and prasugrel.  He has not taken aspirin in a  while.  He is totally asymptomatic.  I am going to restart baby aspirin and change his prasugrel to Plavix.  Essential hypertension History of essential hypertension blood pressure measured today 116/60.  He is not on antihypertensive medications.  Dyslipidemia, goal LDL below 70 History of dyslipidemia on Crestor.  His last lipid profile performed 08/11/2019 revealed a total cholesterol 129, LDL of 69 and HDL 46.  We will recheck a fasting lipid and liver profile.     Runell Gess MD FACP,FACC,FAHA, [facility] 06/27/2021 2:52 PM

## 2021-06-27 NOTE — Patient Instructions (Signed)
Medication Instructions:  START Aspirin 81 mg daily   STOP Prasugrel START Plavix 75 mg daily   *If you need a refill on your cardiac medications before your next appointment, please call your pharmacy*   Lab Work: LIPID/LIVER (come back fasting for this, nothing to eat or drink, no lab appointment needed)  If you have labs (blood work) drawn today and your tests are completely normal, you will receive your results only by: MyChart Message (if you have MyChart) OR A paper copy in the mail If you have any lab test that is abnormal or we need to change your treatment, we will call you to review the results.  Follow-Up: At [facility], you and your health needs are our priority.  As part of our continuing mission to provide you with exceptional heart care, we have created designated Provider Care Teams.  These Care Teams include your primary Cardiologist (physician) and Advanced Practice Providers (APPs -  Physician Assistants and Nurse Practitioners) who all work together to provide you with the care you need, when you need it.  We recommend signing up for the patient portal called "MyChart".  Sign up information is provided on this After Visit Summary.  MyChart is used to connect with patients for Virtual Visits (Telemedicine).  Patients are able to view lab/test results, encounter notes, upcoming appointments, etc.  Non-urgent messages can be sent to your provider as well.   To learn more about what you can do with MyChart, go to [email or web address].    Your next appointment:   12 month(s)  The format for your next appointment:   In Person  Provider:   Nanetta Batty, MD

## 2021-06-27 NOTE — Assessment & Plan Note (Signed)
History of CAD status post anterior MI 09/16/2009 treated with a drug-eluting stent to the proximal LAD.  He had moderate RCA disease at that time with anteroapical hypokinesia.  Subsequent Myoview stress test performed 12/13/2009 showed a small anterior apical scar with no ischemia 2D echo revealed an EF of 50 to 55%.  He presented with an anterior MI 03/28/2019 and underwent acute intervention by Dr. Okey Dupre revealing high-grade segmental proximal LAD stenosis.  2 additional drug-eluting stents were placed in overlapping fashion.  The previously placed LAD stent was patent.  He had stable 60% mid RCA stenosis with interval apical wall motion abnormality and EF of 45 to 50%.  He was discharged home on aspirin and prasugrel.  He has not taken aspirin in a while.  He is totally asymptomatic.  I am going to restart baby aspirin and change his prasugrel to Plavix.

## 2021-06-27 NOTE — Assessment & Plan Note (Signed)
History of dyslipidemia on Crestor.  His last lipid profile performed 08/11/2019 revealed a total cholesterol 129, LDL of 69 and HDL 46.  We will recheck a fasting lipid and liver profile.

## 2021-06-28 DIAGNOSIS — E785 Hyperlipidemia, unspecified: Secondary | ICD-10-CM | POA: Diagnosis not present

## 2021-06-29 ENCOUNTER — Other Ambulatory Visit: Payer: Self-pay

## 2021-06-29 DIAGNOSIS — E785 Hyperlipidemia, unspecified: Secondary | ICD-10-CM

## 2021-06-29 LAB — LIPID PANEL
Chol/HDL Ratio: 6.4 ratio — ABNORMAL HIGH (ref 0.0–5.0)
Cholesterol, Total: 255 mg/dL — ABNORMAL HIGH (ref 100–199)
HDL: 40 mg/dL (ref >39)
LDL Chol Calc (NIH): 186 mg/dL — ABNORMAL HIGH (ref 0–99)
Triglycerides: 156 mg/dL — ABNORMAL HIGH (ref 0–149)
VLDL Cholesterol Cal: 29 mg/dL (ref 5–40)

## 2021-06-29 LAB — HEPATIC FUNCTION PANEL
ALT: 20 IU/L (ref 0–44)
AST: 18 IU/L (ref 0–40)
Albumin: 4.3 g/dL (ref 3.8–4.8)
Alkaline Phosphatase: 79 IU/L (ref 44–121)
Bilirubin Total: 0.5 mg/dL (ref 0.0–1.2)
Bilirubin, Direct: 0.12 mg/dL (ref 0.00–0.40)
Total Protein: 6.7 g/dL (ref 6.0–8.5)

## 2021-09-25 ENCOUNTER — Encounter: Payer: Self-pay | Admitting: Internal Medicine

## 2021-09-25 ENCOUNTER — Telehealth (INDEPENDENT_AMBULATORY_CARE_PROVIDER_SITE_OTHER): Payer: Federal, State, Local not specified - PPO | Admitting: Internal Medicine

## 2021-09-25 VITALS — Wt 212.4 lb

## 2021-09-25 DIAGNOSIS — E785 Hyperlipidemia, unspecified: Secondary | ICD-10-CM | POA: Diagnosis not present

## 2021-09-25 DIAGNOSIS — I2510 Atherosclerotic heart disease of native coronary artery without angina pectoris: Secondary | ICD-10-CM | POA: Diagnosis not present

## 2021-09-25 DIAGNOSIS — I255 Ischemic cardiomyopathy: Secondary | ICD-10-CM | POA: Diagnosis not present

## 2021-09-25 DIAGNOSIS — Z9861 Coronary angioplasty status: Secondary | ICD-10-CM | POA: Diagnosis not present

## 2021-09-25 MED ORDER — ROSUVASTATIN CALCIUM 40 MG PO TABS: 40.0000 mg | ORAL_TABLET | Freq: Every day | ORAL | 3 refills | Status: AC

## 2021-09-25 NOTE — Patient Instructions (Signed)
Medication Instructions:  START aspirin 81mg  daily  RESUME rosuvastatin (crestor) 40mg  daily -- cholesterol  TAKE ONLY clopidogrel (plavix) and STOP prasugrel (effient) -- anti-platelet medication   *If you need a refill on your cardiac medications before your next appointment, please call your pharmacy*   Lab Work: FASTING lipid panel in 3 months to check cholesterol  -- complete a few days before your next visit with Dr.  If you have labs (blood work) drawn today and your tests are completely normal, you will receive your results only by: MyChart Message (if you have MyChart) OR A paper copy in the mail If you have any lab test that is abnormal or we need to change your treatment, we will call you to review the results.   Testing/Procedures: NONE   Follow-Up: At [facility], you and your health needs are our priority.  As part of our continuing mission to provide you with exceptional heart care, we have created designated Provider Care Teams.  These Care Teams include your primary Cardiologist (physician) and Advanced Practice Providers (APPs -  Physician Assistants and Nurse Practitioners) who all work together to provide you with the care you need, when you need it.  We recommend signing up for the patient portal called "MyChart".  Sign up information is provided on this After Visit Summary.  MyChart is used to connect with patients for Virtual Visits (Telemedicine).  Patients are able to view lab/test results, encounter notes, upcoming appointments, etc.  Non-urgent messages can be sent to your provider as well.   To learn more about what you can do with MyChart, go to [employer].    Your next appointment:   3 month(s) - lipid clinic  The format for your next appointment:   In Person or Virtual Visit   Provider:   [facility] Hilty, MD   Other Instructions

## 2021-09-25 NOTE — Progress Notes (Signed)
Virtual Visit via Video Note   This visit type was conducted due to national recommendations for restrictions regarding the COVID-19 Pandemic (e.g. social distancing) in an effort to limit this patient's exposure and mitigate transmission in our community.  Due to his co-morbid illnesses, this patient is at least at moderate risk for complications without adequate follow up.  This format is felt to be most appropriate for this patient at this time.  All issues noted in this document were discussed and addressed.  A limited physical exam was performed with this format.  Please refer to the patient's chart for his consent to telehealth for [facility].      Date:  09/25/2021   ID:  Eric Ramos, DOB 11-Feb-1961, MRN [id number] The patient was identified using 2 identifiers.  Evaluation Performed:  New Patient Evaluation  Patient Location:  [address] [place] [address]  Provider location:   [address], [address] [place], [place] [address]  PCP:  System, Provider Not In  Cardiologist:  Nanetta Batty, MD Electrophysiologist:  None   Chief Complaint:  Manage dyslipidemia  History of Present Illness:    Eric Ramos is a 60 y.o. male who presents via audio/video conferencing for a telehealth visit today.  Mr. Prout is a pleasant 60 year old male with history of coronary artery disease status post PCI last in April 2020.  He recently saw Dr. Allyson Sabal in July of this year.  He was noted to be taking prasugrel since his intervention with aspirin 162 mg.  He was switched to aspirin 81 mg and clopidogrel, however he had stopped taking the aspirin and has been taking clopidogrel and prasugrel for some reason.  He denies any bleeding issues or easy bruising.  In addition he has been out of his Crestor for "a long time".  This likely explains the very high LDL cholesterol which was well controlled 2 years ago.  He reports a varied diet but is very physically active and is  typically an outdoor type person.  The patient does not have symptoms concerning for COVID-19 infection (fever, chills, cough, or new SHORTNESS OF BREATH).    Prior CV studies:   The following studies were reviewed today:  Chart reviewed, lab work  PMHx:  Past Medical History:  Diagnosis Date   Bradycardia    a. Sinus bradycardia in the 40's during 2010 admission.   Coronary artery disease    a. STEMI 09/2009 s/p PTCA/DES to LAD, residual moderate RCA disease (50-60%). b. Low risk stress test 2011 - scar but no ischemia.   Hx of echocardiogram    a. EF 50-55% at time of STEMI 2010. b. Echo 05/2013: EF 55-60%, normal wall motion, normal echo.   Hyperlipemia    Hypertension    NSVT (nonsustained ventricular tachycardia)    a. During 2010 admission for STEMI.    SVT (supraventricular tachycardia) (HCC)    a. 7 beats PSVT by event monitor in 2014.   Syncope    a. Prior episode while having skin lesion removed by dermatologist. b. Recurrence in 2014 while walking to bathroom at [employer]: event monitor ordered but patient only partially wore; BP soft so ACEI d/c'd.    Unexplained weight loss     Past Surgical History:  Procedure Laterality Date   CARDIAC CATHETERIZATION     CORONARY/GRAFT ACUTE MI REVASCULARIZATION N/A 03/28/2019   Procedure: Coronary/Graft Acute MI Revascularization;  Surgeon: Yvonne Kendall, MD;  Location: [facility] INVASIVE CV LAB;  Service:  Cardiovascular;  Laterality: N/A;   LEFT HEART CATH AND CORONARY ANGIOGRAPHY N/A 03/28/2019   Procedure: LEFT HEART CATH AND CORONARY ANGIOGRAPHY;  Surgeon: Yvonne Kendall, MD;  Location: [facility] INVASIVE CV LAB;  Service: Cardiovascular;  Laterality: N/A;    FAMHx:  Family History  Problem Relation Age of Onset   Heart attack Mother    Cancer Father        liver    SOCHx:   reports that he has never smoked. He has never used smokeless tobacco. He reports current alcohol use. He reports that he does not use  drugs.  ALLERGIES:  No Known Allergies  MEDS:  Current Meds  Medication Sig   aspirin 81 MG EC tablet Take 162 mg by mouth daily.    clopidogrel (PLAVIX) 75 MG tablet Take 1 tablet (75 mg total) by mouth daily.   nitroGLYCERIN (NITROSTAT) 0.4 MG SL tablet Place 1 tablet (0.4 mg total) under the tongue every 5 (five) minutes x 3 doses as needed for chest pain.     ROS: Pertinent items noted in HPI and remainder of comprehensive ROS otherwise negative.  Labs/Other Tests and Data Reviewed:    Recent Labs: 06/28/2021: ALT 20   Recent Lipid Panel Lab Results  Component Value Date/Time   CHOL 255 (H) 06/28/2021 12:23 PM   TRIG 156 (H) 06/28/2021 12:23 PM   HDL 40 06/28/2021 12:23 PM   CHOLHDL 6.4 (H) 06/28/2021 12:23 PM   CHOLHDL 6.7 03/29/2019 03:00 AM   LDLCALC 186 (H) 06/28/2021 12:23 PM    Wt Readings from Last 3 Encounters:  09/25/21 212 lb 6.4 oz (96.3 kg)  06/27/21 207 lb (93.9 kg)  07/28/19 191 lb 6.4 oz (86.8 kg)     Exam:    Vital Signs:  Wt 212 lb 6.4 oz (96.3 kg)   BMI 28.81 kg/m    General appearance: alert and no distress Lungs: No visual respiratory difficulty Abdomen: Mildly overweight Extremities: extremities normal, atraumatic, no cyanosis or edema Skin: Skin color, texture, turgor normal. No rashes or lesions Neurologic: Grossly normal Psych: Pleasant  ASSESSMENT & PLAN:    CAD with prior PCI, last in 2020 Ischemic cardiomyopathy, LVEF 45 to 50% Mixed hyperlipidemia, goal LDL less than 70  Mr. Kerwin has had a significant increase in his cholesterol but has been off of his Crestor for "a long time".  He ran out of the medication and it was not renewed.  Recommend restarting Crestor 40 mg daily today we will provide a prescription for that.  We will plan repeat lipids in about 3 months.  He may need some additional therapy.  I encouraged him to work on a diet low in saturated fats and continued physical activity.  We also reiterated that he should  be taking aspirin 81 mg daily and clopidogrel 75 mg daily.  He is to discard the prasugrel, which she has been taking in combination with clopidogrel.  Thanks again for the kind referral.  Follow-up with me in 3 months.  COVID-19 Education: The signs and symptoms of COVID-19 were discussed with the patient and how to seek care for testing (follow up with PCP or arrange E-visit).  The importance of social distancing was discussed today.  Patient Risk:   After full review of this patients clinical status, I feel that they are at least moderate risk at this time.  Time:   Today, I have spent 25 minutes with the patient with telehealth technology discussing lipidemia, antiplatelet management, coronary  artery disease, ischemic cardiomyopathy.     Medication Adjustments/Labs and Tests Ordered: Current medicines are reviewed at length with the patient today.  Concerns regarding medicines are outlined above.   Tests Ordered: No orders of the defined types were placed in this encounter.   Medication Changes: No orders of the defined types were placed in this encounter.   Disposition:  in 3 month(s)  Chrystie Nose, MD, [facility], FACP  [place]  [facility]  Medical Director of the Advanced Lipid Disorders &  Cardiovascular Risk Reduction Clinic Diplomate of the American Board of Clinical Lipidology Attending Cardiologist  Direct Dial: [phone]  Fax: [phone]  Website:  [email or web address]  Chrystie Nose, MD  09/25/2021 8:35 AM

## 2022-04-23 ENCOUNTER — Other Ambulatory Visit: Payer: Self-pay

## 2022-04-23 ENCOUNTER — Emergency Department (HOSPITAL_COMMUNITY)
Admission: EM | Admit: 2022-04-23 | Discharge: 2022-04-24 | Disposition: A | Discharge status: Home / Self Care | Payer: Federal, State, Local not specified - PPO | Attending: Emergency Medicine | Admitting: Emergency Medicine

## 2022-04-23 ENCOUNTER — Emergency Department (HOSPITAL_COMMUNITY): Payer: Federal, State, Local not specified - PPO

## 2022-04-23 ENCOUNTER — Encounter (HOSPITAL_COMMUNITY): Payer: Self-pay

## 2022-04-23 DIAGNOSIS — Z7901 Long term (current) use of anticoagulants: Secondary | ICD-10-CM | POA: Diagnosis not present

## 2022-04-23 DIAGNOSIS — Z7982 Long term (current) use of aspirin: Secondary | ICD-10-CM | POA: Diagnosis not present

## 2022-04-23 DIAGNOSIS — S0993XA Unspecified injury of face, initial encounter: Secondary | ICD-10-CM | POA: Diagnosis not present

## 2022-04-23 DIAGNOSIS — S0990XA Unspecified injury of head, initial encounter: Secondary | ICD-10-CM | POA: Insufficient documentation

## 2022-04-23 DIAGNOSIS — Z23 Encounter for immunization: Secondary | ICD-10-CM | POA: Diagnosis not present

## 2022-04-23 DIAGNOSIS — I2510 Atherosclerotic heart disease of native coronary artery without angina pectoris: Secondary | ICD-10-CM | POA: Insufficient documentation

## 2022-04-23 DIAGNOSIS — I10 Essential (primary) hypertension: Secondary | ICD-10-CM | POA: Diagnosis not present

## 2022-04-23 DIAGNOSIS — S199XXA Unspecified injury of neck, initial encounter: Secondary | ICD-10-CM | POA: Diagnosis not present

## 2022-04-23 DIAGNOSIS — S0031XA Abrasion of nose, initial encounter: Secondary | ICD-10-CM | POA: Diagnosis not present
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
## 2022-04-24 MED ORDER — TETANUS-DIPHTH-ACELL PERTUSSIS 5-2.5-18.5 LF-MCG/0.5 IM SUSY
0.5000 mL | PREFILLED_SYRINGE | Freq: Once | INTRAMUSCULAR | Status: AC
  Administered 2022-04-24: 0.5 mL via INTRAMUSCULAR
  Filled 2022-04-24: qty 0.5
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
## 2022-08-03 ENCOUNTER — Other Ambulatory Visit: Payer: Self-pay | Admitting: Cardiovascular Disease
